# Patient Record
Sex: Male | Born: 1960
Health system: Southern US, Community
[De-identification: ages and names within clinical notes are randomized; demographics above are authoritative.]

## PROBLEM LIST (undated history)

## (undated) HISTORY — PX: REPAIR OF PERFORATED ULCER: SHX6065

---

## 2009-10-13 ENCOUNTER — Encounter: Payer: Self-pay | Admitting: Cardiology

## 2010-03-08 ENCOUNTER — Encounter: Payer: Self-pay | Admitting: Cardiology

## 2010-03-12 DIAGNOSIS — R002 Palpitations: Secondary | ICD-10-CM | POA: Insufficient documentation

## 2010-03-16 ENCOUNTER — Encounter: Payer: Self-pay | Admitting: Cardiology

## 2010-03-17 ENCOUNTER — Ambulatory Visit: Payer: Self-pay | Admitting: Cardiology

## 2010-03-17 DIAGNOSIS — K219 Gastro-esophageal reflux disease without esophagitis: Secondary | ICD-10-CM | POA: Insufficient documentation

## 2010-03-17 DIAGNOSIS — R079 Chest pain, unspecified: Secondary | ICD-10-CM | POA: Insufficient documentation

## 2010-04-08 ENCOUNTER — Telehealth (INDEPENDENT_AMBULATORY_CARE_PROVIDER_SITE_OTHER): Payer: Self-pay | Admitting: *Deleted

## 2010-04-12 ENCOUNTER — Ambulatory Visit: Payer: Self-pay

## 2010-04-12 ENCOUNTER — Ambulatory Visit: Payer: Self-pay | Admitting: Cardiology

## 2010-04-12 ENCOUNTER — Ambulatory Visit (HOSPITAL_COMMUNITY): Admission: RE | Admit: 2010-04-12 | Discharge: 2010-04-12 | Payer: Self-pay | Admitting: Cardiology

## 2010-04-16 ENCOUNTER — Telehealth: Payer: Self-pay | Admitting: Cardiology

## 2010-04-26 ENCOUNTER — Encounter: Payer: Self-pay | Admitting: Cardiology

## 2010-04-27 ENCOUNTER — Ambulatory Visit: Payer: Self-pay | Admitting: Cardiology

## 2010-08-10 ENCOUNTER — Ambulatory Visit: Admit: 2010-08-10 | Payer: Self-pay | Admitting: Cardiology

## 2010-08-10 NOTE — Miscellaneous (Signed)
  Clinical Lists Changes  Observations: Added new observation of PRIMARY MD: Merri Brunette, MD (03/16/2010 13:33) Added new observation of PAST MED HX: Insomnia Palpitations Family history.... heart disease G E R D  (03/16/2010 13:33)       Past History:  Past Medical History: Insomnia Palpitations Family history.... heart disease G E R D

## 2010-08-10 NOTE — Miscellaneous (Signed)
  Clinical Lists Changes  Observations: Added new observation of PAST MED HX: Insomnia Palpitations Family history.... heart disease G E R D Chest pain  stress echo...04/12/2010...60% EF... normal stress EF  60%...stress echo...04/2010 GI surgery for ulcer disease Lipid status (04/26/2010 12:57) Added new observation of PRIMARY MD: Merri Brunette, MD (04/26/2010 12:57)       Past History:  Past Medical History: Insomnia Palpitations Family history.... heart disease G E R D Chest pain  stress echo...04/12/2010...60% EF... normal stress EF  60%...stress echo...04/2010 GI surgery for ulcer disease Lipid status

## 2010-08-10 NOTE — Assessment & Plan Note (Signed)
Summary: per check out/sf   Visit Type:  Follow-up Primary Provider:  Merri Brunette, MD  CC:  chest pain.  History of Present Illness: Patient is seen for followup evaluation of chest pain.  I saw him in the office on March 17, 2010.  He has palpitations.  He had some chest discomfort.  He has a significant family history of coronary disease.  His LDL my history is 115.  Stress echo was done.  The patient had a good exercise level.  EKG was normal.  The stress echo images were normal.  Overall this was a normal stress echo.  Patient returns today doing well.  I have explained all the results to him and his family.  Current Medications (verified): 1)  Aspirin 81 Mg Tbec (Aspirin) .... Take One Tablet By Mouth Daily 2)  Multivitamins   Tabs (Multiple Vitamin) .... Some Days  Allergies (verified): No Known Drug Allergies  Past History:  Past Medical History: Insomnia Palpitations Family history.... heart disease G E R D Chest pain  stress echo...04/12/2010...60% EF... normal stress EF  60%...stress echo...04/2010 GI surgery for ulcer disease Lipid status  Review of Systems       Patient denies fever, chills, headache, sweats, rash, change in vision, change in hearing, chest pain, cough, nausea vomiting, urinary symptoms.  All other systems are reviewed and are negative.  Vital Signs:  Patient profile:   50 year old male Height:      70 inches Weight:      202 pounds BMI:     29.09 Pulse rate:   85 / minute BP sitting:   122 / 60  (left arm) Cuff size:   regular  Vitals Entered By: Hardin Negus, RMA (April 27, 2010 2:52 PM)  Physical Exam  General:  patient is stable. Eyes:  no xanthelasma. Neck:  no jugular venous distention. Lungs:  lungs are clear.  Respiratory effort is nonlabored. Heart:  cardiac exam reveals S1 and S2.  No clicks or significant murmurs. Abdomen:  abdomen soft Extremities:  no peripheral edema. Psych:  patient is oriented to person time  and place affect is normal.   Impression & Recommendations:  Problem # 1:  * LIPID STATUS With patient's family history I do think we should push her LDL below 100.  I talked with him about medications.  He is hesitant.  I have convinced him to start a exercise and weight loss program with followup labs.  Problem # 2:  CHEST PAIN (ICD-786.50)  His updated medication list for this problem includes:    Aspirin 81 Mg Tbec (Aspirin) .Marland Kitchen... Take one tablet by mouth daily This point there is no evidence of significant coronary disease.  He is to remain on aspirin.  No further workup at this point.  Problem # 3:  PALPITATIONS (ICD-785.1)  His updated medication list for this problem includes:    Aspirin 81 Mg Tbec (Aspirin) .Marland Kitchen... Take one tablet by mouth daily Palpitations are resolved.  No further workup.  Patient Instructions: 1)  Your physician recommends that you return for a FASTING lipid and liver profile: in 3 months before appt. with Dr Myrtis Ser (272.2) 2)  Follow up in 3 months.

## 2010-08-10 NOTE — Assessment & Plan Note (Signed)
Summary: np6/heart palp   Visit Type:  Initial Consult Primary Jaekwon Mcclune:  Merri Brunette, MD  CC:  palpitations  /  chest pain.  History of Present Illness: The patient is seen for evaluation of palpitations and chest pain.  He has a strong family history of coronary disease.  His father had coronary disease at a young age and eventually died.  The patient stopped smoking 20 years ago.  However he did dip snuff until 6 years ago.  He is active working outside.  He had some chest discomfort.  His also had some palpitations.  There's been no nausea vomiting or diaphoresis.  He has some reflux symptoms.  None of his symptoms are exertional.  Current Medications (verified): 1)  None  Allergies (verified): No Known Drug Allergies  Past History:  Past Medical History: Insomnia Palpitations Family history.... heart disease G E R D Chest pain GI surgery for ulcer disease Lipid status  Review of Systems       The patient denies fever, chills, headache, sweats, rash, change in vision, change in hearing, chest pain, cough, nausea vomiting, urinary symptoms.  All other systems are reviewed and are negative.  Vital Signs:  Patient profile:   50 year old male Height:      70 inches Weight:      195 pounds BMI:     28.08 Pulse rate:   77 / minute BP sitting:   129 / 70  (left arm) Cuff size:   regular  Vitals Entered By: Burnett Kanaris, CNA (March 17, 2010 2:56 PM)  Physical Exam  General:  The patient is quite stable. Head:  head is atraumatic. Eyes:  no xanthelasma. Neck:  no jugular distention. Chest Wall:  no chest wall tenderness. Lungs:  lungs are clear.  Respiratory effort is not labored. Heart:  cardiac exam reveals S1 and S2.  No clicks or significant murmurs. Abdomen:  abdomen is soft.  He has an old surgical scar. Msk:  no musculoskeletal deformities. Extremities:  no peripheral edema. Skin:  no skin rashes. Psych:  patient is oriented to person time and place.   Affect is normal.   Impression & Recommendations:  Problem # 1:  * LIPID STATUS The patient's total cholesterol triglycerides and HDL are good.  His LDL is 113.  We can consider whether his LDL should be pushed below 100 with his family history.  I have not recommended any medications at this time.  Problem # 2:  GERD (ICD-530.81) The patient does have GERD symptoms.  I do not think that this is angina.  Problem # 3:  PALPITATIONS (ICD-785.1)  Orders: EKG w/ Interpretation (93000) EKG is done and reviewed by me.  I did review the outside tracing.  A copy quality was difficult to assess fully.  EKG today is normal.  Problem # 4:  CHEST PAIN (ICD-786.50)  The patient has had some chest pain.  With his strong risk factors I do feel that more complete workup as needed.  We will arrange for exercise test and an echo.  I will then see him back for followup to check again on his symptoms and to review all of the data.  Orders: Nuclear Stress Test (Nuc Stress Test) Echocardiogram (Echo)  Patient Instructions: 1)  Your physician has requested that you have an echocardiogram.  Echocardiography is a painless test that uses sound waves to create images of your heart. It provides your doctor with information about the size and shape of  your heart and how well your heart's chambers and valves are working.  This procedure takes approximately one hour. There are no restrictions for this procedure. 2)  Your physician has requested that you have an exercise stress myoview.  For further information please visit [email or web address]  Please follow instruction sheet, as given. 3)  Your physician recommends that you schedule a follow-up appointment in: After echo and stress myoview is done.

## 2010-08-10 NOTE — Letter (Signed)
Summary: [facility] Physicians Office Visit Note   [facility] Physicians Office Visit Note   Imported By: Roderic Ovens 04/07/2010 12:36:01  _____________________________________________________________________  External Attachment:    Type:   Image     Comment:   External Document

## 2010-08-10 NOTE — Progress Notes (Signed)
Summary: Stress Echo Pre-Procedure  Phone Note Outgoing Call Call back at [facility] Phone [phone]   Call placed by: Antionette Char RN,  April 08, 2010 12:04 PM Call placed to: Patient Reason for Call: Confirm/change Appt Summary of Call: Left message with patient regarding instructions for stress echo. Bonnita Levan RN

## 2010-08-10 NOTE — Progress Notes (Signed)
Summary: echo results  Phone Note Call from Patient   Caller: Patient [phone] Reason for Call: Talk to Nurse, Lab or Test Results Summary of Call: pt wants echo results Initial call taken by: Glynda Jaeger,  April 16, 2010 2:49 PM  Follow-up for Phone Call        pt aware. Whitney Maeola Sarah RN  April 16, 2010 3:51 PM  Follow-up by: Whitney Maeola Sarah RN,  April 16, 2010 3:51 PM

## 2010-08-10 NOTE — Letter (Signed)
Summary: [facility]icians Office Visit Note   [facility] Physicians Office Visit Note   Imported By: Roderic Ovens 04/07/2010 12:36:30  _____________________________________________________________________  External Attachment:    Type:   Image     Comment:   External Document

## 2010-08-31 ENCOUNTER — Ambulatory Visit: Payer: Self-pay | Admitting: Cardiology

## 2010-09-23 ENCOUNTER — Other Ambulatory Visit: Payer: Self-pay

## 2010-09-23 ENCOUNTER — Ambulatory Visit: Payer: Self-pay | Admitting: Cardiology

## 2019-07-31 ENCOUNTER — Encounter (INDEPENDENT_AMBULATORY_CARE_PROVIDER_SITE_OTHER): Payer: Self-pay

## 2019-07-31 ENCOUNTER — Encounter: Payer: Self-pay | Admitting: Physical Therapy

## 2019-07-31 ENCOUNTER — Other Ambulatory Visit: Payer: Self-pay

## 2019-07-31 ENCOUNTER — Ambulatory Visit: Payer: 59 | Attending: Urology | Admitting: Physical Therapy

## 2019-07-31 DIAGNOSIS — R293 Abnormal posture: Secondary | ICD-10-CM | POA: Insufficient documentation

## 2019-07-31 DIAGNOSIS — M62838 Other muscle spasm: Secondary | ICD-10-CM | POA: Diagnosis present

## 2019-07-31 DIAGNOSIS — M25651 Stiffness of right hip, not elsewhere classified: Secondary | ICD-10-CM | POA: Insufficient documentation

## 2019-07-31 NOTE — Therapy (Addendum)
[facility] [address], [address] [place], [place], [address] Phone: [phone]   Fax:  [phone]  Physical Therapy Treatment  Patient Details  Name: Eric Goodman MRN: [id number] Date of Birth: 1961-06-26 Referring Provider (PT): Noel Christmas, MD Encounter Date: 07/31/2019  PT End of Session - 07/31/19 1715    Visit Number  1    Date for PT Re-Evaluation  10/23/19    PT Start Time  1417    PT Stop Time  1701    PT Time Calculation (min)  164 min    Activity Tolerance  Patient tolerated treatment well    Behavior During Therapy  [facility] for tasks assessed/performed       History reviewed. No pertinent past medical history.  Past Surgical History:  Procedure Laterality Date  . REPAIR OF PERFORATED ULCER     2003    There were no vitals filed for this visit.  Subjective Assessment - 07/31/19 1626    Subjective  I have had pain for a few months.  It is a lot better but still about 2-3 when it hurts.  I have had to use the bathroom a lot more frequently and had erectile dysfunction.    Pertinent History  abdominal surgery for perforated ulcer repair; was in full body cast for 6 weeks as a teenager    Patient Stated Goals  reduce pain and resolve erectile dysfunction    Currently in Pain?  Yes    Pain Score  3    when the pain is there, but doesn't have current   Pain Location  Groin    Pain Orientation  Right    Pain Descriptors / Indicators  Sharp    Pain Type  Acute pain    Pain Radiating Towards  to the rectum or prostate    Pain Onset  More than a month ago    Pain Frequency  Intermittent    Aggravating Factors   comes and goes    Pain Relieving Factors  ibuprophen 800    Effect of Pain on Daily Activities  no         [facility] PT Assessment - 08/06/19 0001      Assessment   Medical Diagnosis  Noel Christmas, MD    Referring Provider (PT)  R10.2 (ICD-10-CM) - Pelvic and perineal pain; N52.01  (ICD-10-CM) - Erectile dysfunction due to arterial insufficiency; N41.1 (ICD-10-CM) - Chronic prostatitis    Onset Date/Surgical Date  --   3 months appx   Prior Therapy  No      Precautions   Precautions  None      Restrictions   Weight Bearing Restrictions  No      Balance Screen   Has the patient fallen in the past 6 months  No      Home Environment   Living Environment  Private residence    Living Arrangements  Spouse/significant other      Prior Function   Level of Independence  Independent    Vocation  Full time employment    Vocation Requirements  [place]      Cognition   Overall Cognitive Status  Within Functional Limits for tasks assessed      Posture/Postural Control   Posture/Postural Control  Postural limitations    Postural Limitations  Increased lumbar lordosis;Increased thoracic kyphosis;Rounded Shoulders   significant at lower thoracic     PROM   Overall PROM Comments  Rt hip IR 25%; bilat hip ER 75%      Strength   Overall Strength Comments  Lt hip flexion and bilatera hip adduction 4/5      Flexibility   Soft Tissue Assessment /Muscle Length  yes    Hamstrings  hamstring 50% limited on Rt side      Palpation   Palpation comment  Rt hip elevated, lumbar paraspinals and h/s tight; diaphragm tight bilat; scar in abdomen restricted      Ambulation/Gait   Gait Pattern  Within Functional Limits                Pelvic Floor Special Questions - 07/31/19 0001    Prior Pelvic/Prostate Exam  Yes    Date of Last Pelvic/Prostate Exam  --   yearly   Marinoff Scale  --   erectile dysfunction   Urinary Leakage  Yes    Activities that cause leaking  With strong urge    Urinary urgency  Yes   burning when peeing has happened   Urinary frequency  laying at night 3-4x/night when it hurts; drinking something makes me go immediately    Fecal incontinence  No     pt identity confirmed and informed consent given to perform internal soft  tissue assessment   pt identity confirmed and informed consent given to perform internal soft tissue assessment Rectal exam 3/5 strength 2-3 sec hold  tight on Rt levator and obdurator internus          PT Education - 07/31/19 1647    Education Details  Access Code: [email or web address], urge to void and self massage techniques    Person(s) Educated  Patient    Methods  Explanation;Demonstration;Handout;Verbal cues    Comprehension  Verbalized understanding;Returned demonstration       PT Short Term Goals - 07/31/19 1720      PT SHORT TERM GOAL #1   Title  Pt will be ind with urge to void techniques    Time  4    Period  Weeks    Status  New    Target Date  08/28/19      PT SHORT TERM GOAL #2   Title  Pt will be ind with HEP    Time  4    Period  Weeks    Status  New    Target Date  10/23/19      PT SHORT TERM GOAL #3   Title  pt will understand how to engage his core correctly when lifting in order to improvecore stability and reduce risk of back pain        PT Long Term Goals - 07/31/19 1717      PT LONG TERM GOAL #1   Title  Pt will report nocturia of 1x/ night or less    Time  12    Period  Weeks    Status  New    Target Date  10/23/19      PT LONG TERM GOAL #2   Title  Pt will report no groin pain during typical day and able to perform all work related duties.    Baseline  pain up to 3/10 and does physically demanding job    Time  12    Period  Weeks    Status  New    Target Date  10/23/19      PT LONG TERM GOAL #3   Title  pt will report he is able to maintain an  erection for duration of intercourse with his spouse    Time  29    Period  Weeks    Status  New    Target Date  10/23/19      PT LONG TERM GOAL #4   Title  Pt will have 50% improvement in urinary frequency    Time  12    Period  Weeks    Status  New    Target Date  10/23/19            Plan - 08/06/19 1246    Clinical Impression Statement  Pt presents to clinic due to perineal  pain and erectile dysfunction.  Pt has chronic prostatitis.  He has not had therapy for the pelvic floor and his doctor referred him due to finding no medical issues regarding his chronic symptoms.  Upon examination, pt does have muscle tension and postural abnormalities as mentioned above. He has abdominal scar adhesions.  Pt also has decreased coordination of the palvic floor making it difficutl for him to relax the pelvic floor muscles.  Pt will benefit from skilled PT to address all impairments and return to maximum function and manage pain.,    Personal Factors and Comorbidities  Time since onset of injury/illness/exacerbation    Examination-Activity Limitations  Toileting    Stability/Clinical Decision Making  Stable/Uncomplicated    Clinical Decision Making  Low    Rehab Potential  Excellent    PT Frequency  2x / week    PT Duration  12 weeks    PT Treatment/Interventions  ADLs/Self Care Home Management;Biofeedback;Electrical Stimulation;Moist Heat;Cryotherapy;Therapeutic activities;Therapeutic exercise;Neuromuscular re-education;Manual techniques;Dry needling;Taping    PT Next Visit Plan  f/u on HEP; urge to void and toileting; biofeedback, breathe and bulge; posture    PT Home Exercise Plan  Access Code: [email or web address]    Consulted and Agree with Plan of Care  Patient       Patient will benefit from skilled therapeutic intervention in order to improve the following deficits and impairments:  Decreased coordination, Decreased strength, Impaired flexibility, Increased fascial restricitons, Increased muscle spasms, Decreased range of motion, Pain, Postural dysfunction  Visit Diagnosis: Other muscle spasm  Abnormal posture  Stiffness of right hip, not elsewhere classified     Problem List Patient Active Problem List   Diagnosis Date Noted  . GERD 03/17/2010  . CHEST PAIN 03/17/2010  . PALPITATIONS 03/12/2010    Junious Silk, PT 08/06/2019, 12:59 PM  [facility] [address] [place], [place], [address] Phone: [phone]   Fax:  [phone]  Name: Eric Goodman MRN: [id number] Date of Birth: 04/05/1961

## 2019-07-31 NOTE — Patient Instructions (Addendum)
Access Code: [email or web address]  URL: [email or web address]  Date: 07/31/2019  Prepared by: Dwana Curd   Exercises Supine Diaphragmatic Breathing with Pelvic Floor Lengthening - 10 reps - 1 sets - 3x daily - 7x weekly Supine Single Knee to Chest Stretch - 5 reps - 1 sets - 10 sec hold                            - 2x daily - 7x weekly Supine Double Knee to Chest - 10 reps - 3 sets - 1x daily - 7x weekly Supine Hip Internal and External Rotation - 10 reps - 1 sets - 5 sec hold - 1x daily - 7x weekly  Relaxation Exercises with the Urge to Void   When you experience an urge to void:  FIRST  Stop and stand very still    Sit down if you can    Don't move    You need to stay very still to maintain control  SECOND Squeeze your pelvic floor muscles 5 times, like a quick flick, to keep from leaking  THIRD Relax  Take a deep breath and then let it out  Try to make the urge go away by using relaxation and visualization techniques  FINALLY When you feel the urge go away somewhat, walk normally to the bathroom.   If the urge gets suddenly stronger on the way, you may stop again and relax to regain control.

## 2019-08-06 NOTE — Addendum Note (Signed)
Addended by: Beatris Si on: 08/06/2019 01:04 PM   Modules accepted: Orders

## 2019-08-07 ENCOUNTER — Emergency Department (HOSPITAL_COMMUNITY)
Admission: EM | Admit: 2019-08-07 | Discharge: 2019-08-07 | Disposition: A | Discharge status: Home / Self Care | Payer: 59 | Attending: Emergency Medicine | Admitting: Emergency Medicine

## 2019-08-07 ENCOUNTER — Emergency Department (HOSPITAL_COMMUNITY): Payer: 59

## 2019-08-07 ENCOUNTER — Ambulatory Visit: Payer: 59 | Admitting: Physical Therapy

## 2019-08-07 ENCOUNTER — Encounter (HOSPITAL_COMMUNITY): Payer: Self-pay

## 2019-08-07 ENCOUNTER — Other Ambulatory Visit: Payer: Self-pay

## 2019-08-07 DIAGNOSIS — I493 Ventricular premature depolarization: Secondary | ICD-10-CM | POA: Insufficient documentation

## 2019-08-07 DIAGNOSIS — F17290 Nicotine dependence, other tobacco product, uncomplicated: Secondary | ICD-10-CM | POA: Diagnosis not present

## 2019-08-07 DIAGNOSIS — R002 Palpitations: Secondary | ICD-10-CM

## 2019-08-07 DIAGNOSIS — F419 Anxiety disorder, unspecified: Secondary | ICD-10-CM | POA: Insufficient documentation

## 2019-08-07 LAB — BASIC METABOLIC PANEL
Anion gap: 11 (ref 5–15)
BUN: 25 mg/dL — ABNORMAL HIGH (ref 6–20)
CO2: 20 mmol/L — ABNORMAL LOW (ref 22–32)
Calcium: 9.2 mg/dL (ref 8.9–10.3)
Chloride: 107 mmol/L (ref 98–111)
Creatinine, Ser: 0.96 mg/dL (ref 0.61–1.24)
GFR calc Af Amer: >60 mL/min (ref >60)
GFR calc non Af Amer: >60 mL/min (ref >60)
Glucose, Bld: 93 mg/dL (ref 70–99)
Potassium: 3.9 mmol/L (ref 3.5–5.1)
Sodium: 138 mmol/L (ref 135–145)

## 2019-08-07 LAB — CBC
HCT: 45.7 % (ref 39.0–52.0)
Hemoglobin: 14.9 g/dL (ref 13.0–17.0)
MCH: 29.6 pg (ref 26.0–34.0)
MCHC: 32.6 g/dL (ref 30.0–36.0)
MCV: 90.7 fL (ref 80.0–100.0)
Platelets: 357 10*3/uL (ref 150–400)
RBC: 5.04 MIL/uL (ref 4.22–5.81)
RDW: 13.2 % (ref 11.5–15.5)
WBC: 8.4 10*3/uL (ref 4.0–10.5)
nRBC: 0 % (ref 0.0–0.2)

## 2019-08-07 LAB — TROPONIN I (HIGH SENSITIVITY): Troponin I (High Sensitivity): <2 ng/L (ref <18)

## 2019-08-07 NOTE — ED Triage Notes (Signed)
Pt reports heart palpitations for the last 2 weeks. Pt saw his PCP this week and EMS came out to his home yesterday for the same but pt did not go to the hospital. Pt reports 2 panic attacks that has increased his palpitations, pt anxious in triage. Pt a.o.

## 2019-08-07 NOTE — ED Provider Notes (Signed)
[facility] EMERGENCY DEPARTMENT Provider Note   CSN: [id number] Arrival date & time: 08/07/19  1606     History Chief Complaint  Patient presents with  . Palpitations    Eric Goodman is a 59 y.o. male with a past medical history of GERD, palpitations, anxiety presenting to the ED with a chief complaint of palpitations.  For the past week reports intermittent palpitations that are been worsened when he has a panic attack.  States that his symptoms got worse today when he had a panic attack at home.  He was evaluated by EMS yesterday for similar symptoms but did not want to be evaluated in the ED.  He had improvement with his palpitations and anxiety with his home Ativan.  He last saw a cardiologist about 10 years ago with negative stress test.  States that his palpitations have improved since arrival in the ED.  Denies any chest pain, shortness of breath, cough, abdominal pain, vomiting, diarrhea, fever, history of DVT or PE, leg swelling or recent immobilization.  HPI     History reviewed. No pertinent past medical history.  Patient Active Problem List   Diagnosis Date Noted  . GERD 03/17/2010  . CHEST PAIN 03/17/2010  . PALPITATIONS 03/12/2010    Past Surgical History:  Procedure Laterality Date  . REPAIR OF PERFORATED ULCER     2003       No family history on file.  Social History   Tobacco Use  . Smoking status: Never Smoker  . Smokeless tobacco: Current User  Substance Use Topics  . Alcohol use: Not on file  . Drug use: Not on file    Home Medications Prior to Admission medications   Not on File    Allergies    Patient has no allergy information on record.  Review of Systems   Review of Systems  Constitutional: Negative for appetite change, chills and fever.  HENT: Negative for ear pain, rhinorrhea, sneezing and sore throat.   Eyes: Negative for photophobia and visual disturbance.  Respiratory: Negative for cough, chest  tightness, shortness of breath and wheezing.   Cardiovascular: Positive for palpitations. Negative for chest pain.  Gastrointestinal: Negative for abdominal pain, blood in stool, constipation, diarrhea, nausea and vomiting.  Genitourinary: Negative for dysuria, hematuria and urgency.  Musculoskeletal: Negative for myalgias.  Skin: Negative for rash.  Neurological: Negative for dizziness, weakness and light-headedness.    Physical Exam Updated Vital Signs BP 133/89 (BP Location: Left Arm)   Pulse 80   Temp 98 F (36.7 C) (Oral)   Resp 18   SpO2 99%   Physical Exam Vitals and nursing note reviewed.  Constitutional:      General: He is not in acute distress.    Appearance: He is well-developed.     Comments: Speaking in complete sentences without difficulty.  HENT:     Head: Normocephalic and atraumatic.     Nose: Nose normal.  Eyes:     General: No scleral icterus.       Left eye: No discharge.     Conjunctiva/sclera: Conjunctivae normal.  Cardiovascular:     Rate and Rhythm: Normal rate and regular rhythm.     Heart sounds: Normal heart sounds. No murmur. No friction rub. No gallop.   Pulmonary:     Effort: Pulmonary effort is normal. No respiratory distress.     Breath sounds: Normal breath sounds.  Abdominal:     General: Bowel sounds are normal. There is  no distension.     Palpations: Abdomen is soft.     Tenderness: There is no abdominal tenderness. There is no guarding.  Musculoskeletal:        General: Normal range of motion.     Cervical back: Normal range of motion and neck supple.     Right lower leg: No edema.     Left lower leg: No edema.     Comments: No lower extremity edema, erythema or calf tenderness bilaterally.  Skin:    General: Skin is warm and dry.     Findings: No rash.  Neurological:     Mental Status: He is alert.     Motor: No abnormal muscle tone.     Coordination: Coordination normal.     ED Results / Procedures / Treatments    Labs (all labs ordered are listed, but only abnormal results are displayed) Labs Reviewed  BASIC METABOLIC PANEL - Abnormal; Notable for the following components:      Result Value   CO2 20 (*)    BUN 25 (*)    All other components within normal limits  CBC  TROPONIN I (HIGH SENSITIVITY)    EKG EKG Interpretation  Date/Time:  Wednesday August 07 2019 16:24:52 EST Ventricular Rate:  94 PR Interval:  132 QRS Duration: 78 QT Interval:  352 QTC Calculation: 440 R Axis:   53 Text Interpretation: Sinus rhythm with occasional Premature ventricular complexes Otherwise normal ECG Confirmed by Kennis Carina [id number]) on 08/07/2019 9:15:26 PM   Radiology DG Chest 2 View  Result Date: 08/07/2019 CLINICAL DATA:  Chest pain palpitation EXAM: CHEST - 2 VIEW COMPARISON:  08/13/2018 FINDINGS: The heart size and mediastinal contours are within normal limits. Both lungs are clear. Mild chronic wedging at the thoracolumbar junction. IMPRESSION: No active cardiopulmonary disease. Electronically Signed   By: Jasmine Pang M.D.   On: 08/07/2019 16:49    Procedures Procedures (including critical care time)  Medications Ordered in ED Medications - No data to display  ED Course  I have reviewed the triage vital signs and the nursing notes.  Pertinent labs & imaging results that were available during my care of the patient were reviewed by me and considered in my medical decision making (see chart for details).    MDM Rules/Calculators/A&P                      59yo M with a past medical history of anxiety presenting to the ED for palpitations that have been intermittent for the past week but worsened today.  He took a dose of his home Ativan with improvement in his palpitations and anxiety. He denies any prior history of DVT, PE, leg swelling or recent immobilization.  He was seen and evaluated by cardiology about 10 years ago with negative stress test.  He denies any vomiting, diarrhea or other  GI symptoms that would predispose him to electrolyte abnormalities.  He is overall well-appearing on my exam.  No lower extremity edema, erythema or calf tenderness that would concern me for DVT.  Is speaking complete sentences without difficulty, no tremors noted.  Denies any chest pain.  Work-up here including troponin, CBC, BMP unremarkable.  EKG shows sinus rhythm with occasional PVCs. Patient without any recurrence of symptoms here in the ED. Suspect that his symptoms are caused by these occasional PVCs and his anxiety.  I doubt ACS, PE or other emergent cause of his symptoms as he is low risk by  Well's criteria and HEART score. Feel that he will benefit from follow-up with cardiology for possible monitoring.  He is agreeable to discharge home.  Return precautions given.   Patient is hemodynamically stable, in NAD, and able to ambulate in the ED. Evaluation does not show pathology that would require ongoing emergent intervention or inpatient treatment. I explained the diagnosis to the patient. Pain has been managed and has no complaints prior to discharge. Patient is comfortable with above plan and is stable for discharge at this time. All questions were answered prior to disposition. Strict return precautions for returning to the ED were discussed. Encouraged follow up with PCP.   An After Visit Summary was printed and given to the patient.   Portions of this note were generated with [employer]obby[employer]. Dictation errors may occur despite best attempts at proofreading.  Final Clinical Impression(s) / ED Diagnoses Final diagnoses:  Palpitations  PVC (premature ventricular contraction)    Rx / DC Orders ED Discharge Orders    None       Delia Heady, PA-C 08/07/19 2137    Maudie Flakes, MD 08/07/19 2306

## 2019-08-07 NOTE — Discharge Instructions (Addendum)
Continue your home medications as they were previously prescribed. Follow-up with your cardiologist for further evaluation. Return to the ED if you start to experience chest pain, shortness of breath, leg swelling, numbness in arms or legs or blurry vision.

## 2019-08-13 ENCOUNTER — Telehealth: Payer: Self-pay | Admitting: Radiology

## 2019-08-13 ENCOUNTER — Other Ambulatory Visit: Payer: Self-pay

## 2019-08-13 ENCOUNTER — Ambulatory Visit (INDEPENDENT_AMBULATORY_CARE_PROVIDER_SITE_OTHER): Payer: 59 | Admitting: Cardiovascular Disease

## 2019-08-13 ENCOUNTER — Encounter: Payer: Self-pay | Admitting: Cardiovascular Disease

## 2019-08-13 VITALS — BP 133/84 | HR 72 | Ht 70.5 in | Wt 210.0 lb

## 2019-08-13 DIAGNOSIS — E782 Mixed hyperlipidemia: Secondary | ICD-10-CM | POA: Diagnosis not present

## 2019-08-13 DIAGNOSIS — R002 Palpitations: Secondary | ICD-10-CM | POA: Diagnosis not present

## 2019-08-13 DIAGNOSIS — E785 Hyperlipidemia, unspecified: Secondary | ICD-10-CM | POA: Insufficient documentation

## 2019-08-13 DIAGNOSIS — R0789 Other chest pain: Secondary | ICD-10-CM

## 2019-08-13 NOTE — Assessment & Plan Note (Signed)
History of hyperlipidemia not on statin therapy with lipid profile performed 10/25/2016 revealing total cholesterol 188, LDL 123 and HDL 51.  I am going to get a fasting lipid liver profile to further evaluate

## 2019-08-13 NOTE — Progress Notes (Signed)
08/13/2019 SAMBA CUMBA   03/03/1961  [id number]  Primary Physician System, Pcp Not In Primary Cardiologist: Lorretta Harp MD Garret Reddish, [place], [place]  HPI:  Eric Goodman is a 59 y.o. mildly overweight married Caucasian male with no children referred by Dr. Nancy Fetter for cardiovascular valuation because of palpitation and atypical chest pain.  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  He has seen Dr. Dola Argyle, cardiologist, 10 years ago for evaluation of palpitations, atypical chest pain and hyperlipidemia.  He did have a negative stress echo at that time.  He has no other cardiac risk factors other than mild untreated hyperlipidemia.  His father apparently had an ICD was taken care of by Dr. Rollene Fare as mother may have had stents as well.  He has been fairly asymptomatic until several weeks ago when he had recurrence of his palpitations.  He was recently seen in the emergency room 08/07/2019 where he was in sinus rhythm his work-up was unremarkable.   Current Meds  Medication Sig  . fluticasone (FLONASE) 50 MCG/ACT nasal spray Place 2 sprays into both nostrils daily.  Marland Kitchen LORazepam (ATIVAN) 0.5 MG tablet Take 0.5 mg by mouth every 8 (eight) hours.  Marland Kitchen omeprazole (PRILOSEC) 20 MG capsule Take 20 mg by mouth daily.  . sertraline (ZOLOFT) 50 MG tablet Take 50 mg by mouth daily.  . tamsulosin (FLOMAX) 0.4 MG CAPS capsule Take 0.4 mg by mouth at bedtime.     Allergies  Allergen Reactions  . Aspirin     Social History   Socioeconomic History  . Marital status: Married    Spouse name: Not on file  . Number of children: Not on file  . Years of education: Not on file  . Highest education level: Not on file  Occupational History  . Not on file  Tobacco Use  . Smoking status: Never Smoker  . Smokeless tobacco: Current User  Substance and Sexual Activity  . Alcohol use: Not on file  . Drug use: Not on file  . Sexual activity: Not on file  Other Topics Concern  . Not on file    Social History Narrative  . Not on file   Social Determinants of Health   Financial Resource Strain:   . Difficulty of Paying Living Expenses: Not on file  Food Insecurity:   . Worried About Charity fundraiser in the Last Year: Not on file  . Ran Out of Food in the Last Year: Not on file  Transportation Needs:   . Lack of Transportation (Medical): Not on file  . Lack of Transportation (Non-Medical): Not on file  Physical Activity:   . Days of Exercise per Week: Not on file  . Minutes of Exercise per Session: Not on file  Stress:   . Feeling of Stress : Not on file  Social Connections:   . Frequency of Communication with Friends and Family: Not on file  . Frequency of Social Gatherings with Friends and Family: Not on file  . Attends Religious Services: Not on file  . Active Member of Clubs or Organizations: Not on file  . Attends Archivist Meetings: Not on file  . Marital Status: Not on file  Intimate Partner Violence:   . Fear of Current or Ex-Partner: Not on file  . Emotionally Abused: Not on file  . Physically Abused: Not on file  . Sexually Abused: Not on file     Review of Systems: General: negative  for chills, fever, night sweats or weight changes.  Cardiovascular: negative for chest pain, dyspnea on exertion, edema, orthopnea, palpitations, paroxysmal nocturnal dyspnea or shortness of breath Dermatological: negative for rash Respiratory: negative for cough or wheezing Urologic: negative for hematuria Abdominal: negative for nausea, vomiting, diarrhea, bright red blood per rectum, melena, or hematemesis Neurologic: negative for visual changes, syncope, or dizziness All other systems reviewed and are otherwise negative except as noted above.    Blood pressure 133/84, pulse 72, height 5' 10.5" (1.791 m), weight 210 lb (95.3 kg), SpO2 97 %.  General appearance: alert and no distress Neck: no adenopathy, no carotid bruit, no JVD, supple, symmetrical,  trachea midline and thyroid not enlarged, symmetric, no tenderness/mass/nodules Lungs: clear to auscultation bilaterally Heart: regular rate and rhythm, S1, S2 normal, no murmur, click, rub or gallop Extremities: extremities normal, atraumatic, no cyanosis or edema Pulses: 2+ and symmetric Skin: Skin color, texture, turgor normal. No rashes or lesions Neurologic: Alert and oriented X 3, normal strength and tone. Normal symmetric reflexes. Normal coordination and gait  EKG not performed today  ASSESSMENT AND PLAN:   Hyperlipidemia History of hyperlipidemia not on statin therapy with lipid profile performed 10/25/2016 revealing total cholesterol 188, LDL 123 and HDL 51.  I am going to get a fasting lipid liver profile to further evaluate  PALPITATIONS Long history of palpitations thought to be PVCs related to anxiety in the past improved with Ativan.  And can get a 2-week Zio patch to further evaluate  CHEST PAIN History of atypical chest pain with minimal cardiac risk factors.  He has had a normal stress echo by Dr. Myrtis Ser 10 years ago.  I am going to get a coronary calcium score to further evaluate      Runell Gess MD [facility], [facility] 08/13/2019 5:04 PM

## 2019-08-13 NOTE — Patient Instructions (Addendum)
Medication Instructions:  Your physician recommends that you continue on your current medications as directed. Please refer to the Current Medication list given to you today.  If you need a refill on your cardiac medications before your next appointment, please call your pharmacy.   Lab work: Fasting lipids and hepatic function  If you have labs (blood work) drawn today and your tests are completely normal, you will receive your results only by: [place] (if you have MyChart) OR A paper copy in the mail If you have any lab test that is abnormal or we need to change your treatment, we will call you to review the results.  Testing/Procedures: Coronary Calcium Score  AND  2 Week Zio Patch  Follow-Up: At [facility], you and your health needs are our priority.  As part of our continuing mission to provide you with exceptional heart care, we have created designated Provider Care Teams.  These Care Teams include your primary Cardiologist (physician) and Advanced Practice Providers (APPs -  Physician Assistants and Nurse Practitioners) who all work together to provide you with the care you need, when you need it. You may see Dr. Gwenlyn Found or one of the following Advanced Practice Providers on your designated Care Team:    Kerin Ransom, PA-C  [place], [place]  Coletta Memos, [place]  Your physician wants you to follow-up in: 4-6 weeks with Dr. Gwenlyn Found  Any Other Special Instructions Will Be Listed Below (If Applicable).   Coronary Calcium Scan A coronary calcium scan is an imaging test used to look for deposits of plaque in the inner lining of the blood vessels of the heart (coronary arteries). Plaque is made up of calcium, protein, and fatty substances. These deposits of plaque can partly clog and narrow the coronary arteries without producing any symptoms or warning signs. This puts a person at risk for a heart attack. This test is recommended for people who are at moderate risk for  heart disease. The test can find plaque deposits before symptoms develop. Tell a health care provider about:  Any allergies you have.  All medicines you are taking, including vitamins, herbs, eye drops, creams, and over-the-counter medicines.  Any problems you or family members have had with anesthetic medicines.  Any blood disorders you have.  Any surgeries you have had.  Any medical conditions you have.  Whether you are pregnant or may be pregnant. What are the risks? Generally, this is a safe procedure. However, problems may occur, including:  Harm to a pregnant woman and her unborn baby. This test involves the use of radiation. Radiation exposure can be dangerous to a pregnant woman and her unborn baby. If you are pregnant or think you may be pregnant, you should not have this procedure done.  Slight increase in the risk of cancer. This is because of the radiation involved in the test. What happens before the procedure? Ask your health care provider for any specific instructions on how to prepare for this procedure. You may be asked to avoid products that contain caffeine, tobacco, or nicotine for 4 hours before the procedure. What happens during the procedure?   You will undress and remove any jewelry from your neck or chest.  You will put on a hospital gown.  Sticky electrodes will be placed on your chest. The electrodes will be connected to an electrocardiogram (ECG) machine to record a tracing of the electrical activity of your heart.  You will lie down on a curved bed that is  attached to the CT scanner.  You may be given medicine to slow down your heart rate so that clear pictures can be created.  You will be moved into the CT scanner, and the CT scanner will take pictures of your heart. During this time, you will be asked to lie still and hold your breath for 2-3 seconds at a time while each picture of your heart is being taken. The procedure may vary among health care  providers and hospitals. What happens after the procedure?  You can get dressed.  You can return to your normal activities.  It is up to you to get the results of your procedure. Ask your health care provider, or the department that is doing the procedure, when your results will be ready. Summary  A coronary calcium scan is an imaging test used to look for deposits of plaque in the inner lining of the blood vessels of the heart (coronary arteries). Plaque is made up of calcium, protein, and fatty substances.  Generally, this is a safe procedure. Tell your health care provider if you are pregnant or may be pregnant.  Ask your health care provider for any specific instructions on how to prepare for this procedure.  A CT scanner will take pictures of your heart.  You can return to your normal activities after the scan is done. This information is not intended to replace advice given to you by your health care provider. Make sure you discuss any questions you have with your health care provider. Document Revised: 01/15/2019 Document Reviewed: 01/15/2019 Elsevier Patient Education  2020 [employer].  Your physician has recommended that you wear a 14 DAY ZIO-PATCH monitor. The Zio patch cardiac monitor continuously records heart rhythm data for up to 14 days, this is for patients being evaluated for multiple types heart rhythms. For the first 24 hours post application, please avoid getting the Zio monitor wet in the shower or by excessive sweating during exercise. After that, feel free to carry on with regular activities. Keep soaps and lotions away from the ZIO XT Patch.  This will be mailed to you, please expect 7-10 days to receive.

## 2019-08-13 NOTE — Telephone Encounter (Signed)
Enrolled patient for a 14 day Zio monitor to be mailed to patients home.  

## 2019-08-13 NOTE — Assessment & Plan Note (Signed)
Long history of palpitations thought to be PVCs related to anxiety in the past improved with Ativan.  And can get a 2-week Zio patch to further evaluate

## 2019-08-13 NOTE — Assessment & Plan Note (Signed)
History of atypical chest pain with minimal cardiac risk factors.  He has had a normal stress echo by Dr. Myrtis Ser 10 years ago.  I am going to get a coronary calcium score to further evaluate

## 2019-08-14 ENCOUNTER — Telehealth: Payer: Self-pay | Admitting: Cardiovascular Disease

## 2019-08-14 ENCOUNTER — Encounter: Payer: Self-pay | Admitting: Physical Therapy

## 2019-08-14 LAB — HEPATIC FUNCTION PANEL
ALT: 26 IU/L (ref 0–44)
AST: 14 IU/L (ref 0–40)
Albumin: 4.7 g/dL (ref 3.8–4.9)
Alkaline Phosphatase: 53 IU/L (ref 39–117)
Bilirubin Total: 0.4 mg/dL (ref 0.0–1.2)
Bilirubin, Direct: 0.13 mg/dL (ref 0.00–0.40)
Total Protein: 7.4 g/dL (ref 6.0–8.5)

## 2019-08-14 LAB — LIPID PANEL
Chol/HDL Ratio: 3.2 ratio (ref 0.0–5.0)
Cholesterol, Total: 181 mg/dL (ref 100–199)
HDL: 56 mg/dL (ref >39)
LDL Chol Calc (NIH): 112 mg/dL — ABNORMAL HIGH (ref 0–99)
Triglycerides: 70 mg/dL (ref 0–149)
VLDL Cholesterol Cal: 13 mg/dL (ref 5–40)

## 2019-08-14 NOTE — Telephone Encounter (Signed)
Left a message for the patient to call back.  

## 2019-08-14 NOTE — Telephone Encounter (Signed)
Patient calling requesting a note to go back to work on Monday 2/8.

## 2019-08-14 NOTE — Telephone Encounter (Signed)
Called pt and explained that Dr. Allyson Sabal typically does not write out of work letters for palpitations. Pt stated he just was not feeling well. Asked pt what symptoms he was having, he stated that it was just palpitations. Pt verbalized understanding about Dr. Allyson Sabal not writing a note.

## 2019-08-14 NOTE — Telephone Encounter (Signed)
Patient returning call.

## 2019-08-14 NOTE — Telephone Encounter (Signed)
The patient stated that he felt like it was better for him to be out of work until 2/8 due to his palpitations. He would like to know if Dr. Allyson Sabal would write him a note excusing him from work until 08/19/19.

## 2019-08-15 ENCOUNTER — Encounter: Payer: Self-pay | Admitting: *Deleted

## 2019-08-15 ENCOUNTER — Other Ambulatory Visit: Payer: Self-pay | Admitting: *Deleted

## 2019-08-15 DIAGNOSIS — E782 Mixed hyperlipidemia: Secondary | ICD-10-CM

## 2019-08-16 ENCOUNTER — Other Ambulatory Visit (INDEPENDENT_AMBULATORY_CARE_PROVIDER_SITE_OTHER): Payer: 59

## 2019-08-16 DIAGNOSIS — R002 Palpitations: Secondary | ICD-10-CM

## 2019-08-19 ENCOUNTER — Telehealth: Payer: Self-pay | Admitting: Cardiovascular Disease

## 2019-08-19 MED ORDER — METOPROLOL SUCCINATE ER 25 MG PO TB24: 25.0000 mg | ORAL_TABLET | Freq: Every day | ORAL | 3 refills | Status: DC

## 2019-08-19 NOTE — Telephone Encounter (Signed)
Pt called to report that his palpitations are worsening and he is having increased anxiety with them. He denies dizziness, SOB, light-headedness but because if the palpations mixed with anxiety since they are making him worried about his health he does not "feel well"... he went to a walk in clinic this past Saturday and they gave gim Lorazepam but he still thinks he needs something to control the palpations. He has only been wearing the Zio patch for only a few days but does not feel he can wait until the monitor is finished to be treated.   Will forward to Dr. Allyson Sabal for review and recommendations.

## 2019-08-19 NOTE — Telephone Encounter (Signed)
Start on Toprol-XL 25 mg a day

## 2019-08-19 NOTE — Telephone Encounter (Signed)
Patient c/o Palpitations:  High priority if patient c/o lightheadedness, shortness of breath, or chest pain  1) How long have you had palpitations/irregular HR/ Afib? Are you having the symptoms now? Off and on and yes having them now/.  2) Are you currently experiencing lightheadedness, SOB or CP? No but his back hurts SOB alittle  3) Do you have a history of afib (atrial fibrillation) or irregular heart rhythm? no  4) Have you checked your BP or HR? (document readings if available) NO  5) Are you experiencing any other symptoms? Patient is weak.

## 2019-08-19 NOTE — Telephone Encounter (Signed)
Called pt and put in new order for medication. Pt verbalized understanding.

## 2019-08-21 ENCOUNTER — Encounter: Payer: Self-pay | Admitting: Physical Therapy

## 2019-08-22 ENCOUNTER — Telehealth: Payer: Self-pay | Admitting: Cardiovascular Disease

## 2019-08-22 NOTE — Telephone Encounter (Signed)
Pt c/o medication issue:  1. Name of Medication: metoprolol succinate (TOPROL XL) 25 MG 24 hr tablet  2. How are you currently taking this medication (dosage and times per day)? Every morning  3. Are you having a reaction (difficulty breathing--STAT)? no  4. What is your medication issue? Patient states he felt fine yesterday, but this morning he started feeling dizzy and has been constantly using the bathroom. He also says he has been having a sharp pain in his shoulder. Please advise.

## 2019-08-22 NOTE — Telephone Encounter (Signed)
LMTCB

## 2019-08-26 ENCOUNTER — Telehealth: Payer: Self-pay | Admitting: Cardiovascular Disease

## 2019-08-26 NOTE — Telephone Encounter (Signed)
Spoke with patient. Patient reports he is not having any symptoms associated with metoprolol. Patient had question about heart monitor. Patient educated to mail heart monitor back to the address provided by the company. Patient verbalized understanding.

## 2019-08-26 NOTE — Telephone Encounter (Signed)
Left message for pt to call.

## 2019-08-26 NOTE — Telephone Encounter (Signed)
New message  Patient c/o Palpitations:  High priority if patient c/o lightheadedness, shortness of breath, or chest pain  1) How long have you had palpitations/irregular HR/ Afib? Are you having the symptoms now? Per patient has had palpitations for a couple of weeks, yes   2) Are you currently experiencing lightheadedness, SOB or CP?no   3) Do you have a history of afib (atrial fibrillation) or irregular heart rhythm? No   4) Have you checked your BP or HR? (document readings if available): no   5) Are you experiencing any other symptoms? No

## 2019-08-26 NOTE — Telephone Encounter (Signed)
Spoke with patient. Patient calling in due to "jerking at time of sleep." Patient reports his body jerks all over when he tries to go to bed. Advised patient to call PCP and follow up with cardiology if recommended by PCP after evaluation. Patient verbalized understanding.

## 2019-08-29 ENCOUNTER — Encounter: Payer: Self-pay | Admitting: Physical Therapy

## 2019-09-03 ENCOUNTER — Ambulatory Visit (INDEPENDENT_AMBULATORY_CARE_PROVIDER_SITE_OTHER)
Admission: RE | Admit: 2019-09-03 | Discharge: 2019-09-03 | Disposition: A | Discharge status: Home / Self Care | Payer: Self-pay | Source: Ambulatory Visit | Attending: Cardiovascular Disease | Admitting: Cardiovascular Disease

## 2019-09-03 ENCOUNTER — Other Ambulatory Visit: Payer: Self-pay

## 2019-09-03 DIAGNOSIS — R002 Palpitations: Secondary | ICD-10-CM

## 2019-09-10 ENCOUNTER — Encounter: Payer: Self-pay | Admitting: Cardiovascular Disease

## 2019-09-10 ENCOUNTER — Other Ambulatory Visit: Payer: Self-pay

## 2019-09-10 ENCOUNTER — Ambulatory Visit (INDEPENDENT_AMBULATORY_CARE_PROVIDER_SITE_OTHER): Payer: 59 | Admitting: Cardiovascular Disease

## 2019-09-10 VITALS — BP 124/74 | HR 68 | Temp 98.2°F | Ht 70.5 in | Wt 208.0 lb

## 2019-09-10 DIAGNOSIS — E782 Mixed hyperlipidemia: Secondary | ICD-10-CM

## 2019-09-10 DIAGNOSIS — R002 Palpitations: Secondary | ICD-10-CM

## 2019-09-10 DIAGNOSIS — R0789 Other chest pain: Secondary | ICD-10-CM

## 2019-09-10 NOTE — Patient Instructions (Signed)
Medication Instructions:  Your physician recommends that you continue on your current medications as directed. Please refer to the Current Medication list given to you today.  If you need a refill on your cardiac medications before your next appointment, please call your pharmacy.   Lab work: Fasting Lipids and Hepatic Function in 3 months If you have labs (blood work) drawn today and your tests are completely normal, you will receive your results only by: [place] (if you have MyChart) OR A paper copy in the mail If you have any lab test that is abnormal or we need to change your treatment, we will call you to review the results.  Testing/Procedures: NONE  Follow-Up: At [facility], you and your health needs are our priority.  As part of our continuing mission to provide you with exceptional heart care, we have created designated Provider Care Teams.  These Care Teams include your primary Cardiologist (physician) and Advanced Practice Providers (APPs -  Physician Assistants and Nurse Practitioners) who all work together to provide you with the care you need, when you need it. You may see Dr. Gwenlyn Found or one of the following Advanced Practice Providers on your designated Care Team:    Kerin Ransom, PA-C  [place], [place]  Coletta Memos, [place]  Your physician wants you to follow-up in: 6 months with Dr. Gwenlyn Found. You will receive a reminder letter in the mail two months in advance. If you don't receive a letter, please call our office to schedule the follow-up appointment.   Any Other Special Instructions Will Be Listed Below (If Applicable).   Cooking With Less Salt Cooking with less salt is one way to reduce the amount of sodium you get from food. Depending on your condition and overall health, your health care provider or diet and nutrition specialist (dietitian) may recommend that you reduce your sodium intake. Most people should have less than 2,300 milligrams (mg) of sodium  each day. If you have high blood pressure (hypertension), you may need to limit your sodium to 1,500 mg each day. Follow the tips below to help reduce your sodium intake. What do I need to know about cooking with less salt? Shopping  Buy sodium-free or low-sodium products. Look for the following words on food labels: ? Low-sodium. ? Sodium-free. ? Reduced-sodium. ? No salt added. ? Unsalted.  Buy fresh or frozen vegetables. Avoid canned vegetables.  Avoid buying meats or protein foods that have been injected with broth or saline solution.  Avoid cured or smoked meats, such as hot dogs, bacon, salami, ham, and bologna. Reading food labels   Check the food label before buying or using packaged ingredients.  Look for products with no more than 140 mg of sodium in one serving.  Do not choose foods with salt as one of the first three ingredients on the ingredients list. If salt is one of the first three ingredients, it usually means the item is high in sodium, because ingredients are listed in order of amount in the food item. Cooking  Use herbs, seasonings without salt, and spices as substitutes for salt in foods.  Use sodium-free baking soda when baking.  Grill, braise, or roast foods to add flavor with less salt.  Avoid adding salt to pasta, rice, or hot cereals while cooking.  Drain and rinse canned vegetables before use.  Avoid adding salt when cooking sweets and desserts.  Cook with low-sodium ingredients. What are some salt alternatives? The following are herbs, seasonings, and spices  that can be used instead of salt to give taste to your food. Herbs should be fresh or dried. Do not choose packaged mixes. Next to the name of the herb, spice, or seasoning are some examples of foods you can pair it with. Herbs  Bay leaves - Soups, meat and vegetable dishes, and spaghetti sauce.  Basil - [employer], soups, pasta, and fish dishes.  Cilantro - Meat, poultry, and  vegetable dishes.  Chili powder - Marinades and Mexican dishes.  Chives - Salad dressings and potato dishes.  Cumin - Mexican dishes, couscous, and meat dishes.  Dill - Fish dishes, sauces, and salads.  Fennel - Meat and vegetable dishes, breads, and cookies.  Garlic (do not use garlic salt) - [place] dishes, meat dishes, salad dressings, and sauces.  Marjoram - Soups, potato dishes, and meat dishes.  Oregano - Pizza and spaghetti sauce.  Parsley - Salads, soups, pasta, and meat dishes.  Rosemary - [place] dishes, salad dressings, soups, and red meats.  Saffron - Fish dishes, pasta, and some poultry dishes.  Sage - Stuffings and sauces.  Tarragon - Fish and Whole Foods.  Thyme - Stuffing, meat, and fish dishes. Seasonings  Lemon juice - Fish dishes, poultry dishes, vegetables, and salads.  Vinegar - Salad dressings, vegetables, and fish dishes. Spices  Cinnamon - Sweet dishes, such as cakes, cookies, and puddings.  Cloves - Gingerbread, puddings, and marinades for meats.  Curry - Vegetable dishes, fish and poultry dishes, and stir-fry dishes.  Ginger - Vegetables dishes, fish dishes, and stir-fry dishes.  Nutmeg - Pasta, vegetables, poultry, fish dishes, and custard. What are some low-sodium ingredients and foods?  Fresh or frozen fruits and vegetables with no sauce added.  Fresh or frozen whole meats, poultry, and fish with no sauce added.  Eggs.  Noodles, pasta, quinoa, rice.  Shredded or puffed wheat or puffed rice.  Regular or quick oats.  Milk, yogurt, hard cheeses, and low-sodium cheeses. Good cheese choices include Swiss, [employer], and [address]. Always check the label for the serving size and sodium content.  Unsalted butter or margarine.  Unsalted nuts.  Sherbet or ice cream (keep to  cup per serving).  Homemade pudding.  Sodium-free baking soda and baking powder. This is not a complete list of low-sodium ingredients and foods.  Contact your dietitian for more options. Summary  Cooking with less salt is one way to reduce the amount of sodium that you get from food.  Buy sodium-free or low-sodium products.  Check the food label before using or buying packaged ingredients.  Use herbs, seasonings without salt, and spices as substitutes for salt in foods. This information is not intended to replace advice given to you by your health care provider. Make sure you discuss any questions you have with your health care provider. Document Revised: 06/09/2017 Document Reviewed: 07/05/2016 Elsevier Patient Education  2020 [employer].

## 2019-09-10 NOTE — Progress Notes (Signed)
09/10/2019 Florinda Marker   [birth year]  [id number]  Primary Physician System, Pcp Not In Primary Cardiologist: Runell Gess MD Milagros Loll, [place], [place]  HPI:  Eric Goodman is a 59 y.o.  mildly overweight married Caucasian male with no children referred by Dr. Wynelle Link for cardiovascular valuation because of palpitation and atypical chest pain.  I last saw him in the office 08/13/2019. 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  He has seen Dr. Willa Rough, cardiologist, 10 years ago for evaluation of palpitations, atypical chest pain and hyperlipidemia.  He did have a negative stress echo at that time.  He has no other cardiac risk factors other than mild untreated hyperlipidemia.  His father apparently had an ICD was taken care of by Dr. Alanda Amass as mother may have had stents as well.  He has been fairly asymptomatic until several weeks ago when he had recurrence of his palpitations.  He was recently seen in the emergency room 08/07/2019 where he was in sinus rhythm his work-up was unremarkable.  I got an event monitor which showed PVCs with short runs of SVT and a coronary calcium score performed 09/03/2019 which was 0.  I did start him on low-dose beta-blockade which was resulted in marked improvement in his palpitations.  He is also seeing a psychologist to manage stress which may also have an impact on his palpitations.  He has atypical chest pain which I have reassured him is noncardiac.    Current Meds  Medication Sig  . fluticasone (FLONASE) 50 MCG/ACT nasal spray Place 2 sprays into both nostrils daily.  Marland Kitchen LORazepam (ATIVAN) 0.5 MG tablet Take 0.5 mg by mouth every 8 (eight) hours.  . metoprolol succinate (TOPROL XL) 25 MG 24 hr tablet Take 1 tablet (25 mg total) by mouth daily.  Marland Kitchen omeprazole (PRILOSEC) 20 MG capsule Take 20 mg by mouth daily.  . sertraline (ZOLOFT) 50 MG tablet Take 100 mg by mouth daily.   . tamsulosin (FLOMAX) 0.4 MG CAPS capsule Take 0.4 mg by mouth at bedtime.      Allergies  Allergen Reactions  . Aspirin     Social History   Socioeconomic History  . Marital status: Married    Spouse name: Not on file  . Number of children: Not on file  . Years of education: Not on file  . Highest education level: Not on file  Occupational History  . Not on file  Tobacco Use  . Smoking status: Never Smoker  . Smokeless tobacco: Current User  Substance and Sexual Activity  . Alcohol use: Not on file  . Drug use: Not on file  . Sexual activity: Not on file  Other Topics Concern  . Not on file  Social History Narrative  . Not on file   Social Determinants of Health   Financial Resource Strain:   . Difficulty of Paying Living Expenses: Not on file  Food Insecurity:   . Worried About Programme researcher, broadcasting/film/video in the Last Year: Not on file  . Ran Out of Food in the Last Year: Not on file  Transportation Needs:   . Lack of Transportation (Medical): Not on file  . Lack of Transportation (Non-Medical): Not on file  Physical Activity:   . Days of Exercise per Week: Not on file  . Minutes of Exercise per Session: Not on file  Stress:   . Feeling of Stress : Not on file  Social Connections:   . Frequency  of Communication with Friends and Family: Not on file  . Frequency of Social Gatherings with Friends and Family: Not on file  . Attends Religious Services: Not on file  . Active Member of Clubs or Organizations: Not on file  . Attends Archivist Meetings: Not on file  . Marital Status: Not on file  Intimate Partner Violence:   . Fear of Current or Ex-Partner: Not on file  . Emotionally Abused: Not on file  . Physically Abused: Not on file  . Sexually Abused: Not on file     Review of Systems: General: negative for chills, fever, night sweats or weight changes.  Cardiovascular: negative for chest pain, dyspnea on exertion, edema, orthopnea, palpitations, paroxysmal nocturnal dyspnea or shortness of breath Dermatological: negative for  rash Respiratory: negative for cough or wheezing Urologic: negative for hematuria Abdominal: negative for nausea, vomiting, diarrhea, bright red blood per rectum, melena, or hematemesis Neurologic: negative for visual changes, syncope, or dizziness All other systems reviewed and are otherwise negative except as noted above.    Blood pressure 124/74, pulse 68, temperature 98.2 F (36.8 C), height 5' 10.5" (1.791 m), weight 208 lb (94.3 kg).  General appearance: alert and no distress Neck: no adenopathy, no carotid bruit, no JVD, supple, symmetrical, trachea midline and thyroid not enlarged, symmetric, no tenderness/mass/nodules Lungs: clear to auscultation bilaterally Heart: regular rate and rhythm, S1, S2 normal, no murmur, click, rub or gallop Extremities: extremities normal, atraumatic, no cyanosis or edema Pulses: 2+ and symmetric Skin: Skin color, texture, turgor normal. No rashes or lesions Neurologic: Alert and oriented X 3, normal strength and tone. Normal symmetric reflexes. Normal coordination and gait  EKG not performed today  ASSESSMENT AND PLAN:   PALPITATIONS History of palpitations improved with the addition of low-dose beta-blockade.  His monitor showed PVCs with short runs of SVT.  He is seeing a psychologist and feels that some of his arrhythmia may be stress related.  I am going to keep him on his beta-blocker fairly 6 months before we decide whether or not to wean him off it.  CHEST PAIN History of atypical chest pain with recent coronary calcium score performed 09/03/2019 which was 0.  I reassured him that his chest pain is not cardiac in nature.  Hyperlipidemia History of mild hyperlipidemia with lipid profile performed 09/03/2019 revealing cholesterol 181, LDL 112 and HDL 56.  He does admit to dietary indiscretion.  He is going to alter his diet we will recheck a lipid liver profile in 3 months.      Lorretta Harp MD FACP,FACC,FAHA, [facility] 09/10/2019 5:18  PM

## 2019-09-10 NOTE — Assessment & Plan Note (Signed)
History of mild hyperlipidemia with lipid profile performed 09/03/2019 revealing cholesterol 181, LDL 112 and HDL 56.  He does admit to dietary indiscretion.  He is going to alter his diet we will recheck a lipid liver profile in 3 months.

## 2019-09-10 NOTE — Assessment & Plan Note (Signed)
History of atypical chest pain with recent coronary calcium score performed 09/03/2019 which was 0.  I reassured him that his chest pain is not cardiac in nature.

## 2019-09-10 NOTE — Assessment & Plan Note (Signed)
History of palpitations improved with the addition of low-dose beta-blockade.  His monitor showed PVCs with short runs of SVT.  He is seeing a psychologist and feels that some of his arrhythmia may be stress related.  I am going to keep him on his beta-blocker fairly 6 months before we decide whether or not to wean him off it.

## 2019-09-11 ENCOUNTER — Encounter: Payer: Self-pay | Admitting: Physical Therapy

## 2019-09-11 ENCOUNTER — Ambulatory Visit: Payer: 59 | Attending: Urology | Admitting: Physical Therapy

## 2019-09-11 DIAGNOSIS — M25651 Stiffness of right hip, not elsewhere classified: Secondary | ICD-10-CM | POA: Insufficient documentation

## 2019-09-11 DIAGNOSIS — R293 Abnormal posture: Secondary | ICD-10-CM | POA: Diagnosis present

## 2019-09-11 DIAGNOSIS — M62838 Other muscle spasm: Secondary | ICD-10-CM | POA: Diagnosis present

## 2019-09-11 NOTE — Patient Instructions (Signed)
Access Code: [email or web address]  URL: [email or web address]  Date: 09/11/2019  Prepared by: Dwana Curd   Exercises Supine Single Knee to Chest Stretch - 5 reps - 1 sets - 10 sec hold                            - 2x daily - 7x weekly Supine Double Knee to Chest - 10 reps - 3 sets - 1x daily - 7x weekly Supine Hip Internal and External Rotation - 10 reps - 1 sets - 5 sec hold - 1x daily - 7x weekly Seated Figure 4 Piriformis Stretch - 3 reps - 1 sets - 30 hold - 1x daily - 7x weekly Seated Hamstring Stretch - 3 reps - 1 sets - 30 sec hold - 1x daily - 7x weekly Seated Diaphragmatic Breathing - 10 reps - 1 sets - 3x daily - 7x weekly Prone Hip Extension with Bent Knee - One Pillow - 10 reps - 2 sets - 1x daily - 7x weekly

## 2019-09-11 NOTE — Therapy (Addendum)
[facility] [address], [place], [place], [address] Phone: [phone]   Fax:  [phone]  Physical Therapy Treatment  Patient Details  Name: Eric Goodman MRN: [id number] Date of Birth: 1960-11-24 Referring Provider (PT): Jacalyn Lefevre D   Encounter Date: 09/11/2019  PT End of Session - 09/11/19 1617    Visit Number  2    Date for PT Re-Evaluation  10/23/19    PT Start Time  2876    PT Stop Time  1614    PT Time Calculation (min)  41 min    Activity Tolerance  Patient tolerated treatment well    Behavior During Therapy  [facility] for tasks assessed/performed       History reviewed. No pertinent past medical history.  Past Surgical History:  Procedure Laterality Date  . REPAIR OF PERFORATED ULCER     2003    There were no vitals filed for this visit.  Subjective Assessment - 09/11/19 1625    Subjective  I haven't had pain since last time I was here.  Still want to make sure the pain doesn't come back and be able to sustain an erection but I have had heart issues that prevent me from having intercourse at this time.    Patient Stated Goals  reduce pain and resolve erectile dysfunction    Currently in Pain?  No/denies                       [facility] Adult PT Treatment/Exercise - 09/11/19 0001      Neuro Re-ed    Neuro Re-ed Details   biofeedback: seated with ball squeeze, relax with stretches and towel roll under pelvis with breathing; standing contract and relax and mini squat with kegel; prone straight leg raise      Lumbar Exercises: Stretches   Active Hamstring Stretch  Right;Left;2 reps;30 seconds    Figure 4 Stretch  1 rep;30 seconds;With overpressure;Seated      Lumbar Exercises: Standing   Other Standing Lumbar Exercises  see above biofeedback - 41m contract; 365mrelax in standing      Lumbar Exercises: Seated   Other Seated Lumbar Exercises  5-51m100melax and 15-17 contract in  sitting      Lumbar Exercises: Prone   Straight Leg Raise  10 reps;3 seconds             PT Education - 09/11/19 1614    Education Details  Access Code: [email or web address]    Person(s) Educated  Patient    Methods  Explanation;Demonstration;Tactile cues;Verbal cues;Handout    Comprehension  Verbalized understanding;Returned demonstration       PT Short Term Goals - 07/31/19 1720      PT SHORT TERM GOAL #1   Title  Pt will be ind with urge to void techniques    Time  4    Period  Weeks    Status  New    [place] Date  08/28/19      PT SHORT TERM GOAL #2   Title  Pt will be ind with HEP    Time  4    Period  Weeks    Status  New    [place] Date  10/23/19      PT SHORT TERM GOAL #3   Title  pt will understand how to engage his core correctly when lifting in order to improvecore stability and reduce risk of back pain  PT Long Term Goals - 09/11/19 1532      PT LONG TERM GOAL #1   Title  Pt will report nocturia of 1x/ night or less    Baseline  not every night and maybe 1x    Status  Achieved      PT LONG TERM GOAL #2   Title  Pt will report no groin pain during typical day and able to perform all work related duties.    Status  Achieved      PT LONG TERM GOAL #3   Title  pt will report he is able to maintain an erection for duration of intercourse with his spouse    Baseline  about 15 minutes      PT LONG TERM GOAL #4   Title  Pt will have 50% improvement in urinary frequency    Baseline  30% better            Plan - 09/11/19 1614    Clinical Impression Statement  Pt did well with biofeedback today.  He had low endurance and strength in standing.  Pt had improved resting tone after doing stretches.  Pt will benefit from skilled PT to continue working on strength and endurance    Personal Factors and Comorbidities  Time since onset of injury/illness/exacerbation    PT Treatment/Interventions  ADLs/Self Care Home Management;Biofeedback;Electrical  Stimulation;Moist Heat;Cryotherapy;Therapeutic activities;Therapeutic exercise;Neuromuscular re-education;Manual techniques;Dry needling;Taping    PT Next Visit Plan  f/u on HEP; toileting techniques; biofeedback, breathe and bulge; posture with foam roll against the wall    PT Home Exercise Plan  Access Code: [email or web address]    Consulted and Agree with Plan of Care  Patient       Patient will benefit from skilled therapeutic intervention in order to improve the following deficits and impairments:  Decreased coordination, Decreased strength, Impaired flexibility, Increased fascial restricitons, Increased muscle spasms, Decreased range of motion, Pain, Postural dysfunction  Visit Diagnosis: Other muscle spasm  Abnormal posture  Stiffness of right hip, not elsewhere classified     Problem List Patient Active Problem List   Diagnosis Date Noted  . Hyperlipidemia 08/13/2019  . GERD 03/17/2010  . CHEST PAIN 03/17/2010  . PALPITATIONS 03/12/2010    Jule Ser, PT 09/11/2019, 5:23 PM  [facility] [address] [place], [place], [address] Phone: [phone]   Fax:  [phone]  Name: Eric Goodman MRN: [id number] Date of Birth: [birth year]  PHYSICAL THERAPY DISCHARGE SUMMARY  Visits from Start of Care: 2  Current functional level related to goals / functional outcomes: See above   Remaining deficits: See above   Education / Equipment: HEP  Plan: Patient agrees to discharge.  Patient goals were not met. Patient is being discharged due to not returning since the last visit.  ?????    Too much time lapsed due to cancellation policy he was discharged from PT at this time  Gustavus Bryant, PT 10/22/19 11:36 AM

## 2019-09-19 ENCOUNTER — Encounter: Payer: Self-pay | Admitting: Physical Therapy

## 2019-10-01 ENCOUNTER — Telehealth: Payer: Self-pay | Admitting: Cardiovascular Disease

## 2019-10-01 NOTE — Telephone Encounter (Signed)
Eric Goodman reports intermittent tightness under his left arm to his left nipple for about 1.5 weeks. The tightness comes and goes and nothing makes the tightness better or worse, including flexing, coughing, food.  He does not remember lifting anything heaving or hurting himself. The tightness is not associated with rest or exertion.  He states this sensation is different than described to Dr. Allyson Sabal at recent visit.  He does report he is taking his metop as prescribed and his palpitations have mostly improved. He has no other symptoms and specifically denies SOB, dizziness, lightheadedness. He had a recent calcium score of 0.   Instructed the patient to call PCP for further evaluation. He will proceed to ER if symptoms worsen or more symptoms occur.  He understands he will be called with further instructions from Dr. Allyson Sabal.

## 2019-10-01 NOTE — Telephone Encounter (Signed)
Pt c/o of Chest Pain: STAT if CP now or developed within 24 hours  1. Are you having CP right now? Yes   2. Are you experiencing any other symptoms (ex. SOB, nausea, vomiting, sweating)? No   3. How long have you been experiencing CP? Past week and a half (got worse today)  4. Is your CP continuous or coming and going? Continuous   5. Have you taken Nitroglycerin? No   Eric Goodman is calling stating he has been experiencing CP for the past week and a half, but it has worsened today. He states he does not have any symptoms besides the CP, but the pain is on his left side and runs under his arms. He states it feels like tightness with random spurts of pain. Please advise. ?

## 2019-10-02 ENCOUNTER — Encounter: Payer: Self-pay | Admitting: Physical Therapy

## 2019-10-24 ENCOUNTER — Ambulatory Visit: Payer: 59 | Admitting: Physical Therapy

## 2019-10-30 ENCOUNTER — Other Ambulatory Visit: Payer: Self-pay | Admitting: Cardiovascular Disease

## 2019-10-30 NOTE — Telephone Encounter (Signed)
*  STAT* If patient is at the pharmacy, call can be transferred to refill team.   1. Which medications need to be refilled? (please list name of each medication and dose if known) metoprolol succinate (TOPROL XL) 25 MG 24 hr tablet  2. Which pharmacy/location (including street and city if local pharmacy) is medication to be sent to? [id number] [place], [place] - [address]  3. Do they need a 30 day or 90 day supply? 90 day  

## 2019-10-31 NOTE — Telephone Encounter (Signed)
metoprolol succinate (TOPROL XL) 25 MG 24 hr tablet 90 tablet 3 08/19/2019    Sig - Route: Take 1 tablet (25 mg total) by mouth daily. - Oral   Sent to pharmacy as: metoprolol succinate (TOPROL XL) 25 MG 24 hr tablet   E-Prescribing Status: Receipt confirmed by pharmacy (08/19/2019 4:38 PM EST)   Pharmacy  [phone] [place], [place] - [address]   Med previously refilled

## 2019-11-07 ENCOUNTER — Other Ambulatory Visit: Payer: Self-pay | Admitting: Cardiovascular Disease

## 2019-11-07 MED ORDER — METOPROLOL SUCCINATE ER 25 MG PO TB24: 25.0000 mg | ORAL_TABLET | Freq: Every day | ORAL | 3 refills | Status: DC

## 2019-11-07 NOTE — Telephone Encounter (Signed)
New Message      *STAT* If patient is at the pharmacy, call can be transferred to refill team.   1. Which medications need to be refilled? (please list name of each medication and dose if known)   metoprolol succinate (TOPROL XL) 25 MG 24 hr tablet     2. Which pharmacy/location (including street and city if local pharmacy) is medication to be sent to? [phone] [place], [place] - [address]  3. Do they need a 30 day or 90 day supply? 90  Pt is out of medication

## 2020-01-28 ENCOUNTER — Other Ambulatory Visit: Payer: Self-pay

## 2020-01-28 ENCOUNTER — Telehealth: Payer: Self-pay | Admitting: Cardiovascular Disease

## 2020-01-28 MED ORDER — METOPROLOL SUCCINATE ER 25 MG PO TB24: 25.0000 mg | ORAL_TABLET | Freq: Every day | ORAL | 3 refills | Status: DC

## 2020-01-28 NOTE — Telephone Encounter (Signed)
*  STAT* If patient is at the pharmacy, call can be transferred to refill team.   1. Which medications need to be refilled? (please list name of each medication and dose if known) metoprolol succinate (TOPROL XL) 25 MG 24 hr tablet  2. Which pharmacy/location (including street and city if local pharmacy) is medication to be sent to? [phone] [place], [place] - [address]  3. Do they need a 30 day or 90 day supply? 90 day

## 2020-07-21 ENCOUNTER — Telehealth: Payer: Self-pay | Admitting: Cardiovascular Disease

## 2020-07-21 MED ORDER — METOPROLOL SUCCINATE ER 25 MG PO TB24: 25.0000 mg | ORAL_TABLET | Freq: Every day | ORAL | 3 refills | Status: DC

## 2020-07-21 NOTE — Telephone Encounter (Signed)
*  STAT* If patient is at the pharmacy, call can be transferred to refill team.   1. Which medications need to be refilled? (please list name of each medication and dose if known) metoprolol succinate (TOPROL XL) 25 MG 24 hr tablet  2. Which pharmacy/location (including street and city if local pharmacy) is medication to be sent to? [phone] [place], [place] - [address]  3. Do they need a 30 day or 90 day supply? 90 day   Patient has an appointment 09/18/2020 with Dr. Allyson Sabal

## 2020-08-04 IMAGING — CT CT CARDIAC CORONARY ARTERY CALCIUM SCORE
3 series · 14 of 20 positions shown, 15 images · non-contrast
Comparison: None.

Addendum:
:
Calcium score scan
CLINICAL DATA: Risk stratification

MEDICATIONS:
No Medications.
TECHNIQUE: The patient was scanned on a Siemens Sensation 16 slice scanner.
Axial non-contrast 3mm slices were carried out through the heart.
The data set was analyzed on a dedicated work station and scored
using the Agatson method.

[Series 2: casc 3.0 bv41 2 bestsyst 270 ms · axial · 0.36mm/px · z∈[-220,-136]mm · 4 of 48 slices shown, 5 images]
[im 10/48  vessel]
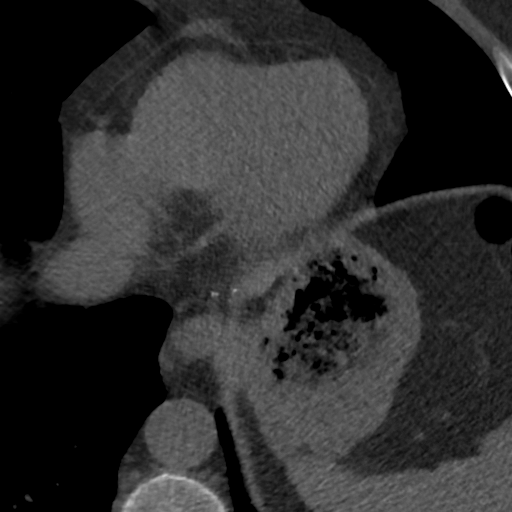
[im 10/48  lung]
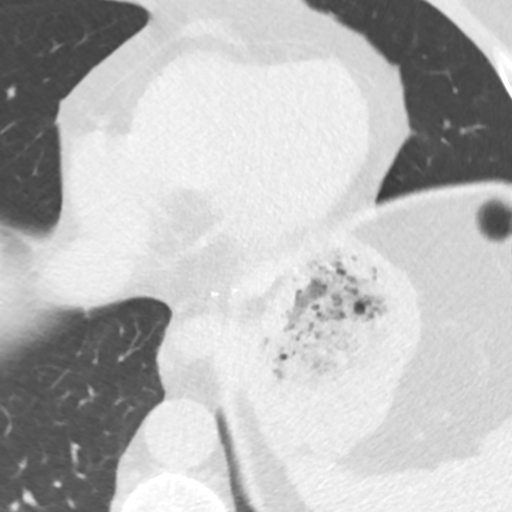
[im 19/48  vessel]
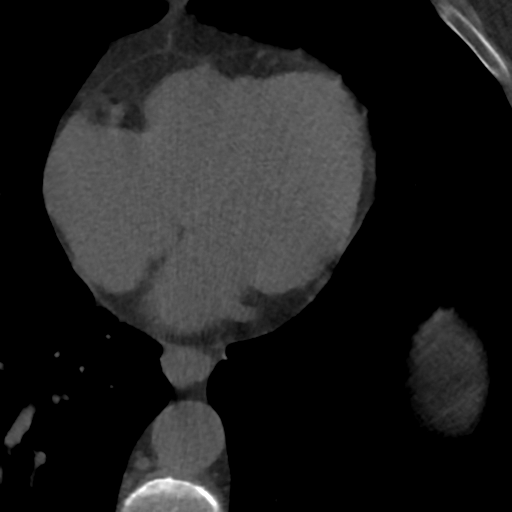
[im 29/48  vessel]
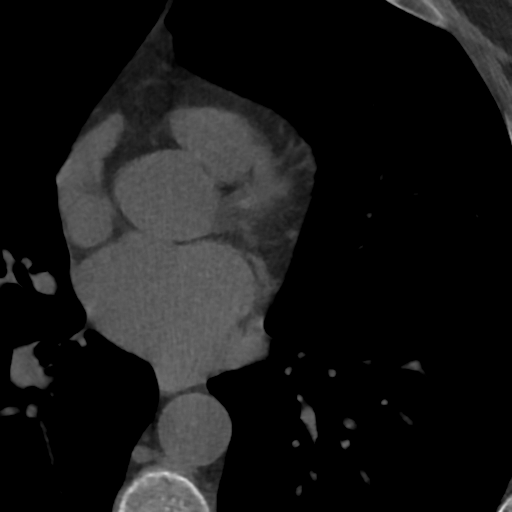
[im 38/48  vessel]
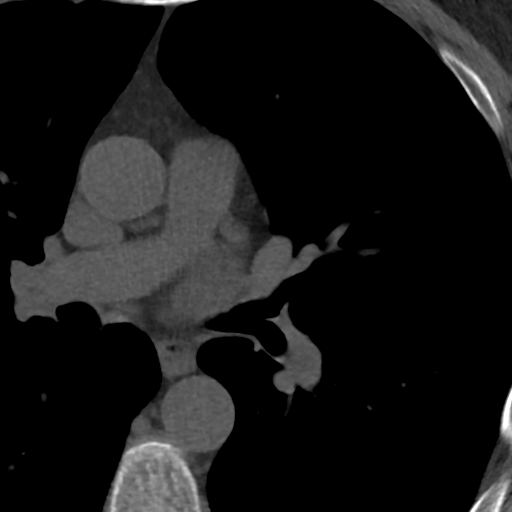

[Series 3: lung 270 ms · axial · 0.71mm/px · z∈[-226,-130]mm · 5 of 48 slices shown]
[im 8/48  lung]
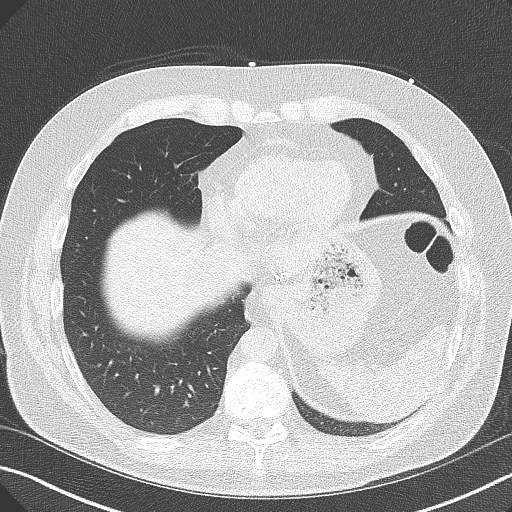
[im 16/48  lung]
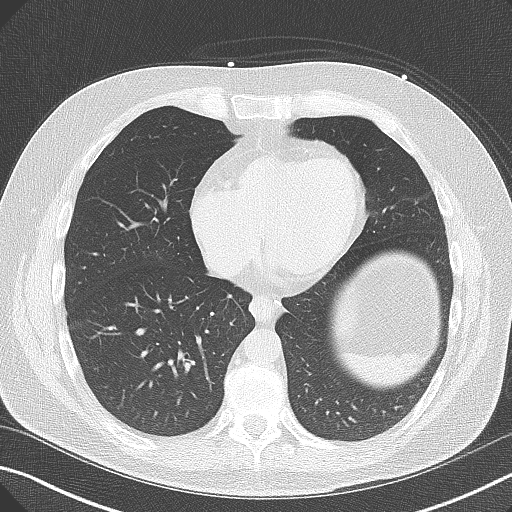
[im 24/48  lung]
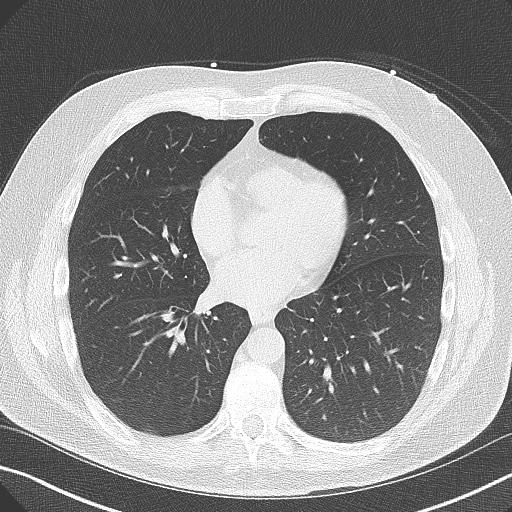
[im 32/48  lung]
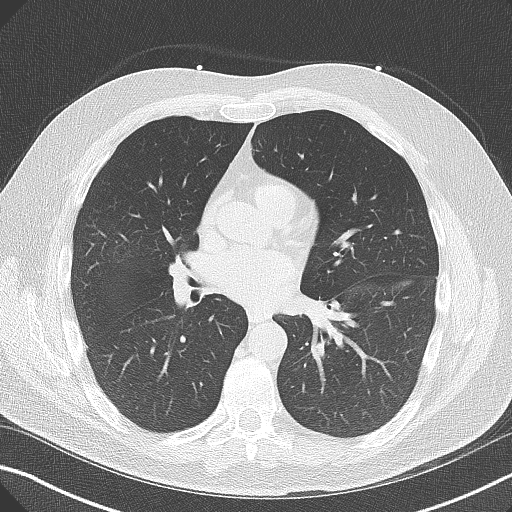
[im 40/48  lung]
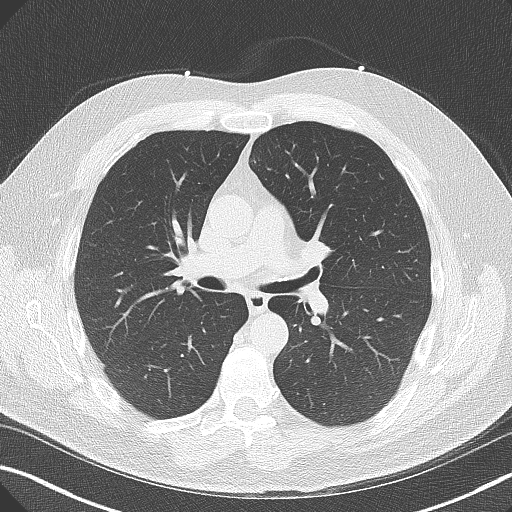

[Series 4: lung st 270 ms · axial · 0.71mm/px · z∈[-226,-130]mm · 5 of 48 slices shown]
[im 8/48  lung]
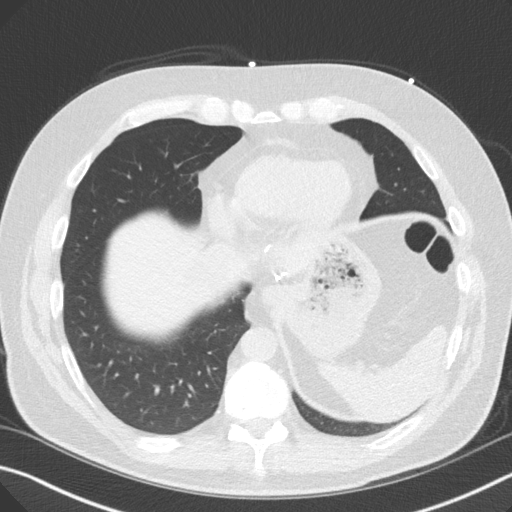
[im 16/48  lung]
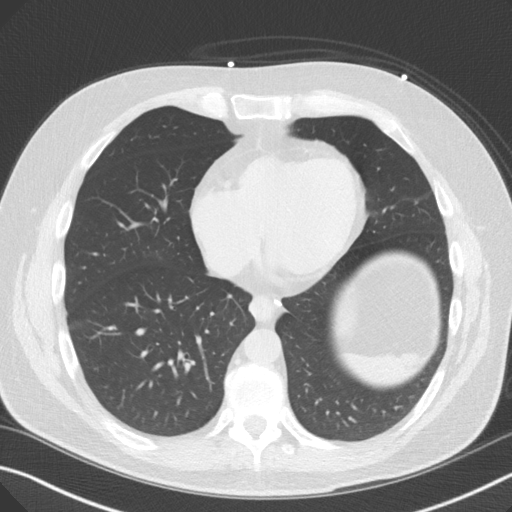
[im 24/48  lung]
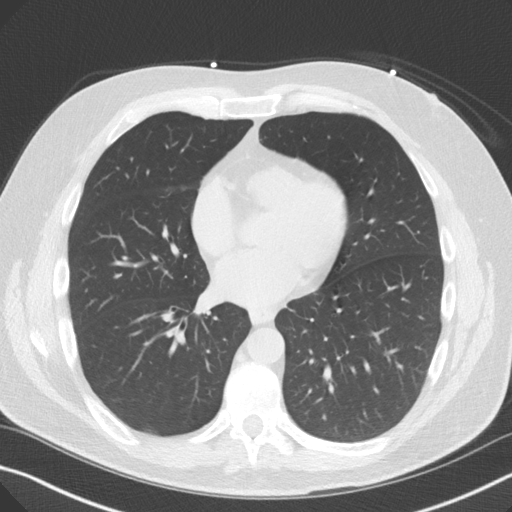
[im 32/48  lung]
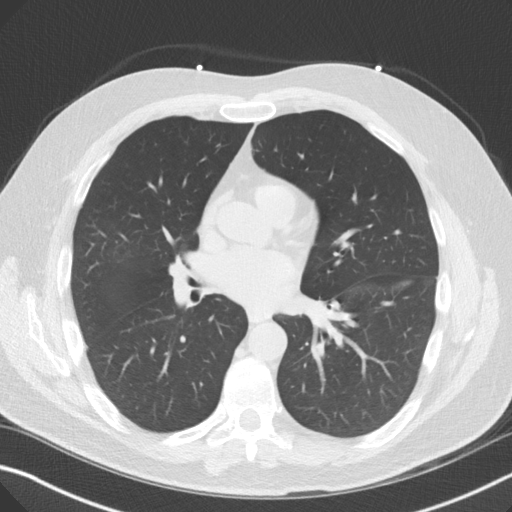
[im 40/48  lung]
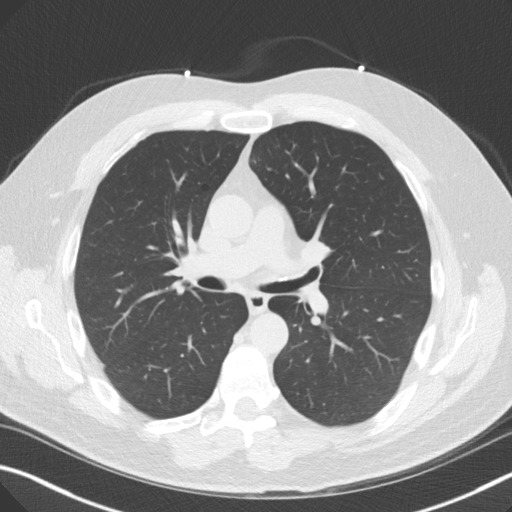

[14 of 20 positions shown; findings below may reference images not displayed]

FINDINGS: Non-cardiac: See separate report from [REDACTED].

Ascending Aorta: Normal caliber

Pericardium: Normal
IMPRESSION: Coronary calcium score of 0 Agatston units. This suggests low risk
for future cardiac events.

Mengesha Best Jaarsoo

EXAM:
OVER-READ INTERPRETATION  CT CHEST

The following report is an over-read performed by radiologist Dr.
over-read does not include interpretation of cardiac or coronary
anatomy or pathology. The coronary calcium score interpretation by
the cardiologist is attached.
FINDINGS: Cardiovascular: Normal heart size.  Mild aortic atherosclerosis.

Mediastinum/lymph nodes: No mass or adenopathy identified.

Lungs/pleura: No pleural effusion identified. No airspace
consolidation, atelectasis or pneumothorax. No suspicious nodules
identified.

Upper abdomen: Postsurgical change noted at the GE junction. No
acute abnormalities identified.

Musculoskeletal: No acute or significant osseous abnormality. Mild
degenerative disc disease within the lower thoracic spine.
IMPRESSION: 1. No significant non-cardiac abnormalities identified.

*** End of Addendum ***
:
Calcium score scan
FINDINGS: Non-cardiac: See separate report from [REDACTED].

Ascending Aorta: Normal caliber

Pericardium: Normal
IMPRESSION: Coronary calcium score of 0 Agatston units. This suggests low risk
for future cardiac events.

Mengesha Best Jaarsoo

## 2020-09-18 ENCOUNTER — Encounter: Payer: Self-pay | Admitting: Cardiovascular Disease

## 2020-09-18 ENCOUNTER — Ambulatory Visit (INDEPENDENT_AMBULATORY_CARE_PROVIDER_SITE_OTHER): Payer: 59 | Admitting: Cardiovascular Disease

## 2020-09-18 ENCOUNTER — Other Ambulatory Visit: Payer: Self-pay

## 2020-09-18 VITALS — BP 112/72 | HR 82 | Ht 70.5 in | Wt 215.0 lb

## 2020-09-18 DIAGNOSIS — R002 Palpitations: Secondary | ICD-10-CM

## 2020-09-18 DIAGNOSIS — R0789 Other chest pain: Secondary | ICD-10-CM | POA: Diagnosis not present

## 2020-09-18 DIAGNOSIS — E782 Mixed hyperlipidemia: Secondary | ICD-10-CM | POA: Diagnosis not present

## 2020-09-18 NOTE — Assessment & Plan Note (Signed)
History of palpitations in the past with event monitor showing PVCs with short runs of SVT.  I did begin him on a low-dose beta-blocker which resulted in marked improvement.  He also saw a psychologist for stress management which improved his palpitations as well.  He no longer complains of palpitations.

## 2020-09-18 NOTE — Patient Instructions (Signed)
Medication Instructions:  Your physician recommends that you continue on your current medications as directed. Please refer to the Current Medication list given to you today.  *If you need a refill on your cardiac medications before your next appointment, please call your pharmacy*   Follow-Up: At [facility], you and your health needs are our priority.  As part of our continuing mission to provide you with exceptional heart care, we have created designated Provider Care Teams.  These Care Teams include your primary Cardiologist (physician) and Advanced Practice Providers (APPs -  Physician Assistants and Nurse Practitioners) who all work together to provide you with the care you need, when you need it.  We recommend signing up for the patient portal called "MyChart".  Sign up information is provided on this After Visit Summary.  MyChart is used to connect with patients for Virtual Visits (Telemedicine).  Patients are able to view lab/test results, encounter notes, upcoming appointments, etc.  Non-urgent messages can be sent to your provider as well.   To learn more about what you can do with MyChart, go to [email or web address].    Your next appointment:   12 month(s)  The format for your next appointment:   In Person  Provider:   Nanetta Batty, MD   Other Instructions  Heart-Healthy Eating Plan Heart-healthy meal planning includes:  Eating less unhealthy fats.  Eating more healthy fats.  Making other changes in your diet. Talk with your doctor or a diet specialist (dietitian) to create an eating plan that is right for you. What are tips for following this plan? Cooking Avoid frying your food. Try to bake, boil, grill, or broil it instead. You can also reduce fat by:  Removing the skin from poultry.  Removing all visible fats from meats.  Steaming vegetables in water or broth. Meal planning  At meals, divide your plate into four equal parts: ? Fill one-half of your  plate with vegetables and green salads. ? Fill one-fourth of your plate with whole grains. ? Fill one-fourth of your plate with lean protein foods.  Eat 4-5 servings of vegetables per day. A serving of vegetables is: ? 1 cup of raw or cooked vegetables. ? 2 cups of raw leafy greens.  Eat 4-5 servings of fruit per day. A serving of fruit is: ? 1 medium whole fruit. ?  cup of dried fruit. ?  cup of fresh, frozen, or canned fruit. ?  cup of 100% fruit juice.  Eat more foods that have soluble fiber. These are apples, broccoli, carrots, beans, peas, and barley. Try to get 20-30 g of fiber per day.  Eat 4-5 servings of nuts, legumes, and seeds per week: ? 1 serving of dried beans or legumes equals  cup after being cooked. ? 1 serving of nuts is  cup. ? 1 serving of seeds equals 1 tablespoon.   General information  Eat more home-cooked food. Eat less restaurant, buffet, and fast food.  Limit or avoid alcohol.  Limit foods that are high in starch and sugar.  Avoid fried foods.  Lose weight if you are overweight.  Keep track of how much salt (sodium) you eat. This is important if you have high blood pressure. Ask your doctor to tell you more about this.  Try to add vegetarian meals each week. Fats  Choose healthy fats. These include olive oil and canola oil, flaxseeds, walnuts, almonds, and seeds.  Eat more omega-3 fats. These include salmon, mackerel, sardines, tuna, flaxseed oil, and ground  flaxseeds. Try to eat fish at least 2 times each week.  Check food labels. Avoid foods with trans fats or high amounts of saturated fat.  Limit saturated fats. ? These are often found in animal products, such as meats, butter, and cream. ? These are also found in plant foods, such as palm oil, palm kernel oil, and coconut oil.  Avoid foods with partially hydrogenated oils in them. These have trans fats. Examples are stick margarine, some tub margarines, cookies, crackers, and other  baked goods. What foods can I eat? Fruits All fresh, canned (in natural juice), or frozen fruits. Vegetables Fresh or frozen vegetables (raw, steamed, roasted, or grilled). Green salads. Grains Most grains. Choose whole wheat and whole grains most of the time. Rice and pasta, including brown rice and pastas made with whole wheat. Meats and other proteins Lean, well-trimmed beef, veal, pork, and lamb. Chicken and [place] without skin. All fish and shellfish. Wild duck, rabbit, pheasant, and venison. Egg whites or low-cholesterol egg substitutes. Dried beans, peas, lentils, and tofu. Seeds and most nuts. Dairy Low-fat or nonfat cheeses, including ricotta and mozzarella. Skim or 1% milk that is liquid, powdered, or evaporated. Buttermilk that is made with low-fat milk. Nonfat or low-fat yogurt. Fats and oils Non-hydrogenated (trans-free) margarines. Vegetable oils, including soybean, sesame, sunflower, olive, peanut, safflower, corn, canola, and cottonseed. Salad dressings or mayonnaise made with a vegetable oil. Beverages Mineral water. Coffee and tea. Diet carbonated beverages. Sweets and desserts Sherbet, gelatin, and fruit ice. Small amounts of dark chocolate. Limit all sweets and desserts. Seasonings and condiments All seasonings and condiments. The items listed above may not be a complete list of foods and drinks you can eat. Contact a dietitian for more options. What foods should I avoid? Fruits Canned fruit in heavy syrup. Fruit in cream or butter sauce. Fried fruit. Limit coconut. Vegetables Vegetables cooked in cheese, cream, or butter sauce. Fried vegetables. Grains Breads that are made with saturated or trans fats, oils, or whole milk. Croissants. Sweet rolls. Donuts. High-fat crackers, such as cheese crackers. Meats and other proteins Fatty meats, such as hot dogs, ribs, sausage, bacon, rib-eye roast or steak. High-fat deli meats, such as salami and bologna. Caviar. Domestic  duck and goose. Organ meats, such as liver. Dairy Cream, sour cream, cream cheese, and creamed cottage cheese. Whole-milk cheeses. Whole or 2% milk that is liquid, evaporated, or condensed. Whole buttermilk. Cream sauce or high-fat cheese sauce. Yogurt that is made from whole milk. Fats and oils Meat fat, or shortening. Cocoa butter, hydrogenated oils, palm oil, coconut oil, palm kernel oil. Solid fats and shortenings, including bacon fat, salt pork, lard, and butter. Nondairy cream substitutes. Salad dressings with cheese or sour cream. Beverages Regular sodas and juice drinks with added sugar. Sweets and desserts Frosting. Pudding. Cookies. Cakes. Pies. Milk chocolate or white chocolate. Buttered syrups. Full-fat ice cream or ice cream drinks. The items listed above may not be a complete list of foods and drinks to avoid. Contact a dietitian for more information. Summary  Heart-healthy meal planning includes eating less unhealthy fats, eating more healthy fats, and making other changes in your diet.  Eat a balanced diet. This includes fruits and vegetables, low-fat or nonfat dairy, lean protein, nuts and legumes, whole grains, and heart-healthy oils and fats. This information is not intended to replace advice given to you by your health care provider. Make sure you discuss any questions you have with your health care provider. Document Revised: 08/31/2017 Document  Reviewed: 08/04/2017 Elsevier Patient Education  2021 [employer].

## 2020-09-18 NOTE — Assessment & Plan Note (Signed)
History of atypical chest pain in the past with coronary calcium score performed 09/03/2019 which which was 0.  He no longer has chest pain.

## 2020-09-18 NOTE — Progress Notes (Signed)
09/18/2020 Florinda Marker   11-26-1960  [id number]  Primary Physician Deatra James, MD Primary Cardiologist: Runell Gess MD Milagros Loll, [place], [place]  HPI:  Eric Goodman is a 60 y.o.  mildly overweight married Caucasian male with no children referred by Dr.Sunfor cardiovascular valuation because of palpitation and atypical chest pain.  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 He has seen Dr. Willa Rough, cardiologist, 10 years ago for evaluation of palpitations, atypical chest pain and hyperlipidemia. He did have a negative stress echo at that time. He has no other cardiac risk factors other than mild untreated hyperlipidemia. His father apparently had an ICD was taken care of by Dr. Alanda Amass as mother may have had stents as well. He has been fairly asymptomatic until several weeks ago when he had recurrence of his palpitations. He was recently seen in the emergency room 08/07/2019 where he was in sinus rhythm his work-up was unremarkable.  I got an event monitor which showed PVCs with short runs of SVT and a coronary calcium score performed 09/03/2019 which was 0.  I did start him on low-dose beta-blockade which was resulted in marked improvement in his palpitations.  He is also seeing a psychologist to manage stress which may also have an impact on his palpitations.  He has atypical chest pain which I have reassured him is noncardiac.  Since I saw him a year ago he is done well.  Unfortunately his father-in-law is ill which is caused some stress in the family.  He is no longer complaining of palpitations nor is he complaining of chest pain.   Current Meds  Medication Sig  . fluticasone (FLONASE) 50 MCG/ACT nasal spray Place 2 sprays into both nostrils daily.  Marland Kitchen LORazepam (ATIVAN) 0.5 MG tablet Take 0.5 mg by mouth every 8 (eight) hours.  . metoprolol succinate (TOPROL XL) 25 MG 24 hr tablet Take 1 tablet (25 mg total) by mouth daily.   Marland Kitchen omeprazole (PRILOSEC) 20 MG capsule Take 20 mg by mouth daily.  . sertraline (ZOLOFT) 50 MG tablet Take 100 mg by mouth daily.   . tamsulosin (FLOMAX) 0.4 MG CAPS capsule Take 0.4 mg by mouth at bedtime.     Allergies  Allergen Reactions  . Aspirin     Social History   Socioeconomic History  . Marital status: Married    Spouse name: Not on file  . Number of children: Not on file  . Years of education: Not on file  . Highest education level: Not on file  Occupational History  . Not on file  Tobacco Use  . Smoking status: Never Smoker  . Smokeless tobacco: Current User  Substance and Sexual Activity  . Alcohol use: Not on file  . Drug use: Not on file  . Sexual activity: Not on file  Other Topics Concern  . Not on file  Social History Narrative  . Not on file   Social Determinants of Health   Financial Resource Strain: Not on file  Food Insecurity: Not on file  Transportation Needs: Not on file  Physical Activity: Not on file  Stress: Not on file  Social Connections: Not on file  Intimate Partner Violence: Not on file     Review of Systems: General: negative for chills, fever, night sweats or weight changes.  Cardiovascular: negative for chest pain, dyspnea on exertion, edema, orthopnea, palpitations, paroxysmal nocturnal dyspnea or shortness of breath Dermatological: negative for  rash Respiratory: negative for cough or wheezing Urologic: negative for hematuria Abdominal: negative for nausea, vomiting, diarrhea, bright red blood per rectum, melena, or hematemesis Neurologic: negative for visual changes, syncope, or dizziness All other systems reviewed and are otherwise negative except as noted above.    Blood pressure 112/72, pulse 82, height 5' 10.5" (1.791 m), weight 215 lb (97.5 kg).  General appearance: alert and no distress Neck: no adenopathy, no carotid bruit, no JVD, supple, symmetrical, trachea midline and thyroid not enlarged, symmetric, no  tenderness/mass/nodules Lungs: clear to auscultation bilaterally Heart: regular rate and rhythm, S1, S2 normal, no murmur, click, rub or gallop Extremities: extremities normal, atraumatic, no cyanosis or edema Pulses: 2+ and symmetric Skin: Skin color, texture, turgor normal. No rashes or lesions Neurologic: Alert and oriented X 3, normal strength and tone. Normal symmetric reflexes. Normal coordination and gait  EKG sinus rhythm at 82 without ST or T wave changes.  I personally reviewed this EKG.  ASSESSMENT AND PLAN:   PALPITATIONS History of palpitations in the past with event monitor showing PVCs with short runs of SVT.  I did begin him on a low-dose beta-blocker which resulted in marked improvement.  He also saw a psychologist for stress management which improved his palpitations as well.  He no longer complains of palpitations.  CHEST PAIN History of atypical chest pain in the past with coronary calcium score performed 09/03/2019 which which was 0.  He no longer has chest pain.  Hyperlipidemia History of mild hyperlipidemia with lipid profile performed 08/14/2019 revealing total cholesterol 181, LDL of 112 and HDL of 56.  I am going to provide him with a heart healthy diet.  Given his coronary calcium score is 0, I do not feel compelled to start him on a statin drug at this time.      Runell Gess MD FACP,FACC,FAHA, [facility] 09/18/2020 3:48 PM

## 2020-09-18 NOTE — Assessment & Plan Note (Signed)
History of mild hyperlipidemia with lipid profile performed 08/14/2019 revealing total cholesterol 181, LDL of 112 and HDL of 56.  I am going to provide him with a heart healthy diet.  Given his coronary calcium score is 0, I do not feel compelled to start him on a statin drug at this time.

## 2021-08-06 ENCOUNTER — Other Ambulatory Visit: Payer: Self-pay | Admitting: Cardiovascular Disease

## 2021-09-06 ENCOUNTER — Other Ambulatory Visit: Payer: Self-pay

## 2021-09-06 ENCOUNTER — Ambulatory Visit (INDEPENDENT_AMBULATORY_CARE_PROVIDER_SITE_OTHER): Payer: 59 | Admitting: Podiatry

## 2021-09-06 ENCOUNTER — Ambulatory Visit (INDEPENDENT_AMBULATORY_CARE_PROVIDER_SITE_OTHER): Payer: 59

## 2021-09-06 DIAGNOSIS — M2142 Flat foot [pes planus] (acquired), left foot: Secondary | ICD-10-CM | POA: Diagnosis not present

## 2021-09-06 DIAGNOSIS — M2141 Flat foot [pes planus] (acquired), right foot: Secondary | ICD-10-CM

## 2021-09-13 ENCOUNTER — Other Ambulatory Visit: Payer: 59

## 2021-09-13 NOTE — Progress Notes (Signed)
° °  Subjective:  61 y.o. male presenting today as a new patient for evaluation of bilateral flatfoot which is becoming painful.  Patient states that he has constant pain in the feet.  He uses OTC insoles with not much improvement.  This is been ongoing for several years.  He denies a history of injury.  No past medical history on file.  Past Surgical History:  Procedure Laterality Date   REPAIR OF PERFORATED ULCER     2003   Allergies  Allergen Reactions   Aspirin     Objective/Physical Exam General: The patient is alert and oriented x3 in no acute distress.  Dermatology: Skin is warm, dry and supple bilateral lower extremities. Negative for open lesions or macerations.  Vascular: Palpable pedal pulses bilaterally. No edema or erythema noted. Capillary refill within normal limits.  Neurological: Epicritic and protective threshold grossly intact bilaterally.   Musculoskeletal Exam: Range of motion within normal limits to all pedal and ankle joints bilateral. Muscle strength 5/5 in all groups bilateral.  Upon weightbearing there is a medial longitudinal arch collapse bilaterally. Remove foot valgus noted to the bilateral lower extremities with excessive pronation upon mid stance.  Radiographic Exam:  Normal osseous mineralization. Joint spaces preserved. No fracture/dislocation/boney destruction.   Pes planus noted on radiographic exam lateral views. Decreased calcaneal inclination and metatarsal declination angle is noted. Anterior break in the cyma line noted on lateral views. Medial talar head to deviation noted on AP radiograph.   Assessment: 1. pes planus bilateral   Plan of Care:  1. Patient was evaluated. X-Rays reviewed.  2.  Continue meloxicam 15 mg/day as per neurology 3.  Appointment with Pedorthist for custom molded orthotics 4.  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 Felecia Shelling,  DPM    [address]                                        [place], [place] [address]                Office [phone]  Fax [phone]

## 2021-10-06 ENCOUNTER — Encounter: Payer: Self-pay | Admitting: Cardiovascular Disease

## 2021-10-06 ENCOUNTER — Other Ambulatory Visit: Payer: Self-pay

## 2021-10-06 ENCOUNTER — Ambulatory Visit (INDEPENDENT_AMBULATORY_CARE_PROVIDER_SITE_OTHER): Payer: 59 | Admitting: Cardiovascular Disease

## 2021-10-06 DIAGNOSIS — R0789 Other chest pain: Secondary | ICD-10-CM | POA: Diagnosis not present

## 2021-10-06 DIAGNOSIS — R002 Palpitations: Secondary | ICD-10-CM

## 2021-10-06 DIAGNOSIS — E782 Mixed hyperlipidemia: Secondary | ICD-10-CM

## 2021-10-06 NOTE — Assessment & Plan Note (Signed)
History of hyperlipidemia not on statin therapy followed by his PCP 

## 2021-10-06 NOTE — Patient Instructions (Signed)

## 2021-10-06 NOTE — Progress Notes (Signed)
? ? ? ?10/06/2021 ?MICA RELEFORD   ?10/25/1960  ?[id number] ? ?Primary Physician Deatra James, MD ?Primary Cardiologist: Runell Gess MD Nicholes [place]am[place], [place] ? ?HPI:  Eric Goodman is a 61 y.o.  mildly overweight married Caucasian male with no children referred by Dr. Wynelle Link for cardiovascular valuation because of palpitation and atypical chest pain.  I last saw him in the office 09/18/2020. 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  He has seen Dr. Willa Rough, cardiologist, 10 years ago for evaluation of palpitations, atypical chest pain and hyperlipidemia.  He did have a negative stress echo at that time.  He has no other cardiac risk factors other than mild untreated hyperlipidemia.  His father apparently had an ICD was taken care of by Dr. Alanda Amass as mother may have had stents as well.  He has been fairly asymptomatic until several weeks ago when he had recurrence of his palpitations.  He was recently seen in the emergency room 08/07/2019 where he was in sinus rhythm his work-up was unremarkable. ?  ?I got an event monitor which showed PVCs with short runs of SVT and a coronary calcium score performed 09/03/2019 which was 0.  I did start him on low-dose beta-blockade which was resulted in marked improvement in his palpitations.  He is also seeing a psychologist to manage stress which may also have an impact on his palpitations.  He has atypical chest pain which I have reassured him is noncardiac. ?  ?Since I saw him a year ago he is done well.  Unfortunately his father-in-law is still ill which is caused some stress in the family.  He is no longer complaining of palpitations and has complained of infrequent atypical subxiphoid chest pain.  He also complains of having sensation of coldness in his feet although he has palpable pedal pulses and denies claudication. ? ? ?Current Meds  ?Medication Sig  ? fluticasone (FLONASE) 50 MCG/ACT nasal spray Place 2 sprays into both nostrils daily.  ? LORazepam (ATIVAN)  0.5 MG tablet Take 0.5 mg by mouth every 8 (eight) hours.  ? metoprolol succinate (TOPROL-XL) 25 MG 24 hr tablet TAKE 1 TABLET (25 MG TOTAL) BY MOUTH DAILY.  ? omeprazole (PRILOSEC) 20 MG capsule Take 20 mg by mouth daily.  ? sertraline (ZOLOFT) 50 MG tablet Take 100 mg by mouth daily.   ? tamsulosin (FLOMAX) 0.4 MG CAPS capsule Take 0.4 mg by mouth at bedtime.  ?  ? ?Allergies  ?Allergen Reactions  ? Aspirin   ? ? ?Social History  ? ?Socioeconomic History  ? Marital status: Married  ?  Spouse name: Not on file  ? Number of children: Not on file  ? Years of education: Not on file  ? Highest education level: Not on file  ?Occupational History  ? Not on file  ?Tobacco Use  ? Smoking status: Never  ? Smokeless tobacco: Current  ?Substance and Sexual Activity  ? Alcohol use: Not on file  ? Drug use: Not on file  ? Sexual activity: Not on file  ?Other Topics Concern  ? Not on file  ?Social History Narrative  ? Not on file  ? ?Social Determinants of Health  ? ?Financial Resource Strain: Not on file  ?Food Insecurity: Not on file  ?Transportation Needs: Not on file  ?Physical Activity: Not on file  ?Stress: Not on file  ?Social Connections: Not on file  ?Intimate Partner Violence: Not on file  ?  ? ?Review of Systems: ?  General: negative for chills, fever, night sweats or weight changes.  ?Cardiovascular: negative for chest pain, dyspnea on exertion, edema, orthopnea, palpitations, paroxysmal nocturnal dyspnea or shortness of breath ?Dermatological: negative for rash ?Respiratory: negative for cough or wheezing ?Urologic: negative for hematuria ?Abdominal: negative for nausea, vomiting, diarrhea, bright red blood per rectum, melena, or hematemesis ?Neurologic: negative for visual changes, syncope, or dizziness ?All other systems reviewed and are otherwise negative except as noted above. ? ? ? ?Blood pressure (!) 146/90, pulse 74, height 5\' 10"  (1.778 m), weight 222 lb (100.7 kg), SpO2 97 %.  ?General appearance: alert and  no distress ?Neck: no adenopathy, no carotid bruit, no JVD, supple, symmetrical, trachea midline, and thyroid not enlarged, symmetric, no tenderness/mass/nodules ?Lungs: clear to auscultation bilaterally ?Heart: regular rate and rhythm, S1, S2 normal, no murmur, click, rub or gallop ?Extremities: extremities normal, atraumatic, no cyanosis or edema ?Pulses: 2+ and symmetric ?Skin: Skin color, texture, turgor normal. No rashes or lesions ?Neurologic: Grossly normal ? ?EKG sinus rhythm at 74 without ST or T wave changes.  Personally reviewed this EKG. ? ?ASSESSMENT AND PLAN:  ? ?PALPITATIONS ?History of palpitations in the past shown to be PVCs and short runs of SVT on monitor improved with low-dose beta-blocker. ? ?CHEST PAIN ?History of atypical chest pain in the past with a coronary calcium score performed 09/03/2019 which was 0.  These episodes are much less frequent. ? ?Hyperlipidemia ?History of hyperlipidemia not on statin therapy followed by his PCP. ? ? ? ? ?09/05/2019 MD FACP,FACC,FAHA, FSCAI ?10/06/2021 ?9:33 AM ?

## 2021-10-06 NOTE — Assessment & Plan Note (Signed)
History of palpitations in the past shown to be PVCs and short runs of SVT on monitor improved with low-dose beta-blocker. ?

## 2021-10-06 NOTE — Assessment & Plan Note (Signed)
History of atypical chest pain in the past with a coronary calcium score performed 09/03/2019 which was 0.  These episodes are much less frequent. ?

## 2021-11-07 ENCOUNTER — Other Ambulatory Visit: Payer: Self-pay | Admitting: Cardiovascular Disease

## 2022-11-13 ENCOUNTER — Other Ambulatory Visit: Payer: Self-pay | Admitting: Cardiovascular Disease

## 2023-02-14 ENCOUNTER — Other Ambulatory Visit: Payer: Self-pay | Admitting: Cardiovascular Disease

## 2023-02-15 ENCOUNTER — Encounter (HOSPITAL_BASED_OUTPATIENT_CLINIC_OR_DEPARTMENT_OTHER): Payer: Self-pay | Admitting: Emergency Medicine

## 2023-02-15 ENCOUNTER — Emergency Department (HOSPITAL_BASED_OUTPATIENT_CLINIC_OR_DEPARTMENT_OTHER): Payer: 59

## 2023-02-15 ENCOUNTER — Emergency Department (HOSPITAL_BASED_OUTPATIENT_CLINIC_OR_DEPARTMENT_OTHER)
Admission: EM | Admit: 2023-02-15 | Discharge: 2023-02-15 | Disposition: A | Discharge status: Home / Self Care | Payer: Worker's Compensation | Attending: Emergency Medicine | Admitting: Emergency Medicine

## 2023-02-15 ENCOUNTER — Other Ambulatory Visit: Payer: Self-pay

## 2023-02-15 DIAGNOSIS — I10 Essential (primary) hypertension: Secondary | ICD-10-CM | POA: Diagnosis not present

## 2023-02-15 DIAGNOSIS — W1789XA Other fall from one level to another, initial encounter: Secondary | ICD-10-CM | POA: Insufficient documentation

## 2023-02-15 DIAGNOSIS — S22068A Other fracture of T7-T8 thoracic vertebra, initial encounter for closed fracture: Secondary | ICD-10-CM | POA: Diagnosis not present

## 2023-02-15 DIAGNOSIS — S22058A Other fracture of T5-T6 vertebra, initial encounter for closed fracture: Secondary | ICD-10-CM | POA: Insufficient documentation

## 2023-02-15 DIAGNOSIS — S22048A Other fracture of fourth thoracic vertebra, initial encounter for closed fracture: Secondary | ICD-10-CM | POA: Diagnosis not present

## 2023-02-15 DIAGNOSIS — S22008A Other fracture of unspecified thoracic vertebra, initial encounter for closed fracture: Secondary | ICD-10-CM

## 2023-02-15 DIAGNOSIS — Z79899 Other long term (current) drug therapy: Secondary | ICD-10-CM | POA: Insufficient documentation

## 2023-02-15 DIAGNOSIS — Y990 Civilian activity done for income or pay: Secondary | ICD-10-CM | POA: Insufficient documentation

## 2023-02-15 DIAGNOSIS — M546 Pain in thoracic spine: Secondary | ICD-10-CM | POA: Diagnosis present

## 2023-02-15 MED ORDER — KETOROLAC TROMETHAMINE 30 MG/ML IJ SOLN
30.0000 mg | Freq: Once | INTRAMUSCULAR | Status: AC
  Administered 2023-02-15: 30 mg via INTRAMUSCULAR
  Filled 2023-02-15: qty 1

## 2023-02-15 MED ORDER — IBUPROFEN 600 MG PO TABS: 600.0000 mg | ORAL_TABLET | Freq: Four times a day (QID) | ORAL | 0 refills | Status: AC | PRN

## 2023-02-15 MED ORDER — HYDROCODONE-ACETAMINOPHEN 5-325 MG PO TABS
1.0000 | ORAL_TABLET | ORAL | 0 refills | Status: AC | PRN
Start: 2023-02-15 — End: ?

## 2023-02-15 NOTE — ED Provider Triage Note (Signed)
Emergency Medicine Provider Triage Evaluation Note  Eric Goodman , a 62 y.o. male  was evaluated in triage.  Pt complains of back pain after a fall.  [place]  He fell off of a trailer flush onto his mid back earlier today.  He was sent for CT imaging which demonstrates several spinous process fractures.  No chest pain or shortness of breath.  No abdominal pain.  No lower extremity weakness or pain.  Review of Systems  Positive: Back pain Negative: Headache, neck pain  Physical Exam  BP (!) 152/88 (BP Location: Right Arm)   Pulse 81   Temp 98.4 F (36.9 C) (Oral)   Resp 18   Ht 5\' 10"  (1.778 m)   Wt 97.5 kg   SpO2 100%   BMI 30.85 kg/m  Gen:   Awake, no distress   Resp:  Normal effort  MSK:   Moves extremities without difficulty  Other:  Tenderness over the mid thoracic spine, consistent with spinous process fractures.  Medical Decision Making  Medically screening exam initiated at 5:04 PM.  Appropriate orders placed.  Florinda Marker was informed that the remainder of the evaluation will be completed by another provider, this initial triage assessment does not replace that evaluation, and the importance of remaining in the ED until their evaluation is complete.     Renne Crigler, PA-C 02/15/23 [phone]

## 2023-02-15 NOTE — Progress Notes (Signed)
Orthopedic Tech Progress Note Patient Details:  Eric Goodman 1960/11/19 [id number] TLSO Brace has been ordered from [facility]  Patient ID: Eric Goodman, male   DOB: May 05, 1961, 62 y.o.   MRN: [id number]  [name] 02/15/2023, 6:44 PM

## 2023-02-15 NOTE — ED Triage Notes (Signed)
Pt via pov from UC after a fall at work. UC sent pt for ct scan of his back - xray was negative. Pt reports his foot slipped and he fell, injuring his back. Endorses hitting his head, denies loc or dizziness since then. Pt reports the pain is in his thoracic spine. Pt alert & oriented, nad noted.

## 2023-02-15 NOTE — ED Notes (Signed)
Pt verbalized understanding of d/c instructions, meds, and followup care. Denies questions. VSS, no distress noted. Steady gait to exit with all belongings. Delay for Ortho TLSO brace.

## 2023-02-15 NOTE — ED Provider Notes (Signed)
[place] EMERGENCY DEPARTMENT AT [facility] Provider Note   CSN: [id number] Arrival date & time: 02/15/23  1404     History  Chief Complaint  Patient presents with   Back Pain    Eric Goodman is a 62 y.o. male.  Pt is a 62 yo male with pmhx significant for htn, gerd, and depression.  Pt was at work today and injured his back after a fall from his truck.  He denies any head injury.  Pain is localized to his thoracic spine.  He is ambulatory.       Home Medications Prior to Admission medications   Medication Sig Start Date End Date Taking? Authorizing Provider  HYDROcodone-acetaminophen (NORCO/VICODIN) 5-325 MG tablet Take 1 tablet by mouth every 4 (four) hours as needed. 02/15/23  Yes Jacalyn Lefevre, MD  ibuprofen (ADVIL) 600 MG tablet Take 1 tablet (600 mg total) by mouth every 6 (six) hours as needed. 02/15/23  Yes Jacalyn Lefevre, MD  fluticasone (FLONASE) 50 MCG/ACT nasal spray Place 2 sprays into both nostrils daily.    [provider]  LORazepam (ATIVAN) 0.5 MG tablet Take 0.5 mg by mouth every 8 (eight) hours.    [provider]  metoprolol succinate (TOPROL-XL) 25 MG 24 hr tablet TAKE 1 TABLET (25 MG TOTAL) BY MOUTH DAILY. NEED OV. 02/14/23   Runell Gess, MD  omeprazole (PRILOSEC) 20 MG capsule Take 20 mg by mouth daily.    [provider]  sertraline (ZOLOFT) 50 MG tablet Take 100 mg by mouth daily.     [provider]  tamsulosin (FLOMAX) 0.4 MG CAPS capsule Take 0.4 mg by mouth at bedtime. 07/10/19   [provider]      Allergies    Aspirin    Review of Systems   Review of Systems  Musculoskeletal:  Positive for back pain.  All other systems reviewed and are negative.   Physical Exam Updated Vital Signs BP (!) 152/88 (BP Location: Right Arm)   Pulse 81   Temp 98.4 F (36.9 C) (Oral)   Resp 18   Ht 5\' 10"  (1.778 m)   Wt 97.5 kg   SpO2 100%   BMI 30.85 kg/m  Physical Exam Vitals and nursing  note reviewed.  Constitutional:      Appearance: Normal appearance.  HENT:     Head: Normocephalic and atraumatic.     Right Ear: External ear normal.     Left Ear: External ear normal.     Nose: Nose normal.     Mouth/Throat:     Mouth: Mucous membranes are moist.     Pharynx: Oropharynx is clear.  Eyes:     Extraocular Movements: Extraocular movements intact.     Conjunctiva/sclera: Conjunctivae normal.     Pupils: Pupils are equal, round, and reactive to light.  Cardiovascular:     Rate and Rhythm: Normal rate and regular rhythm.     Pulses: Normal pulses.     Heart sounds: Normal heart sounds.  Pulmonary:     Effort: Pulmonary effort is normal.     Breath sounds: Normal breath sounds.  Abdominal:     General: Abdomen is flat. Bowel sounds are normal.     Palpations: Abdomen is soft.  Musculoskeletal:     Cervical back: Normal range of motion and neck supple.       Back:  Skin:    General: Skin is warm.     Capillary Refill: Capillary refill takes less  than 2 seconds.  Neurological:     General: No focal deficit present.     Mental Status: He is alert and oriented to person, place, and time.  Psychiatric:        Mood and Affect: Mood normal.        Behavior: Behavior normal.     ED Results / Procedures / Treatments   Labs (all labs ordered are listed, but only abnormal results are displayed) Labs Reviewed - No data to display  EKG None  Radiology CT Thoracic Spine Wo Contrast  Result Date: 02/15/2023 CLINICAL DATA:  Back trauma, no prior imaging (Age >= 16y). Thoracic back pain after a fall. EXAM: CT THORACIC SPINE WITHOUT CONTRAST TECHNIQUE: Multidetector CT images of the thoracic were obtained using the standard protocol without intravenous contrast. RADIATION DOSE REDUCTION: This exam was performed according to the departmental dose-optimization program which includes automated exposure control, adjustment of the mA and/or kV according to patient size and/or  use of iterative reconstruction technique. COMPARISON:  Calcium score CT 09/03/2019 FINDINGS: Alignment: Mild upper thoracic dextroscoliosis.  No listhesis. Vertebrae: Acute fractures of the T4 through T8 spinous processes with up to mild displacement. Mild chronic wedging of multiple lower thoracic vertebral bodies. No definite acute vertebral body fracture. No suspicious osseous lesion. Paraspinal and other soft tissues: Punctate nonobstructing calculus in the upper pole of the left kidney. Postsurgical changes in the GE junction region. Disc levels: Mild disc and facet degeneration in the mid and lower thoracic spine. No evidence of high-grade spinal canal stenosis. Mild-to-moderate neural foraminal stenosis on the left at T9-10 and on the right at T10-11 due to facet spurring. IMPRESSION: Acute T4-T8 spinous process fractures. Electronically Signed   By: Sebastian Ache M.D.   On: 02/15/2023 16:30    Procedures Procedures    Medications Ordered in ED Medications  ketorolac (TORADOL) 30 MG/ML injection 30 mg (30 mg Intramuscular Given 02/15/23 1850)    ED Course/ Medical Decision Making/ A&P                                 Medical Decision Making Risk Prescription drug management.   This patient presents to the ED for concern of back pain s/p fall, this involves an extensive number of treatment options, and is a complaint that carries with it a high risk of complications and morbidity.  The differential diagnosis includes fx, strain   Co morbidities that complicate the patient evaluation   htn, gerd, and depression   Additional history obtained:  Additional history obtained from epic chart review  Imaging Studies ordered:  I ordered imaging studies including ct chest  I independently visualized and interpreted imaging which showed Acute T4-T8 spinous process fractures.  I agree with the radiologist interpretation   Medicines ordered and prescription drug management:  I ordered  medication including toradol  for sx  Reevaluation of the patient after these medicines showed that the patient improved I have reviewed the patients home medicines and have made adjustments as needed   Test Considered:  ct   Problem List / ED Course:  Acute fx T4-T8. Pt placed in a brace.  He is stable for d/c.  Return if worse.    Reevaluation:  After the interventions noted above, I reevaluated the patient and found that they have :improved   Social Determinants of Health:  Lives at home   Dispostion:  After consideration of the  diagnostic results and the patients response to treatment, I feel that the patent would benefit from discharge with outpatient f/u.          Final Clinical Impression(s) / ED Diagnoses Final diagnoses:  Closed fracture of spinous process of thoracic vertebra, initial encounter [facility])    Rx / DC Orders ED Discharge Orders          Ordered    HYDROcodone-acetaminophen (NORCO/VICODIN) 5-325 MG tablet  Every 4 hours PRN        02/15/23 1817    ibuprofen (ADVIL) 600 MG tablet  Every 6 hours PRN        02/15/23 1818              Jacalyn Lefevre, MD 02/15/23 1920

## 2023-02-15 NOTE — ED Notes (Signed)
[facility] called for STAT TLSO Brace.

## 2023-05-15 ENCOUNTER — Other Ambulatory Visit: Payer: Self-pay

## 2023-05-15 MED ORDER — METOPROLOL SUCCINATE ER 25 MG PO TB24: 25.0000 mg | ORAL_TABLET | Freq: Every day | ORAL | 0 refills | Status: DC

## 2023-05-29 ENCOUNTER — Encounter: Payer: Self-pay | Admitting: Cardiovascular Disease

## 2023-05-29 ENCOUNTER — Ambulatory Visit: Payer: 59 | Attending: Cardiovascular Disease | Admitting: Cardiovascular Disease

## 2023-05-29 VITALS — BP 130/80 | HR 74 | Ht 70.0 in | Wt 220.0 lb

## 2023-05-29 DIAGNOSIS — R002 Palpitations: Secondary | ICD-10-CM

## 2023-05-29 DIAGNOSIS — E782 Mixed hyperlipidemia: Secondary | ICD-10-CM | POA: Diagnosis not present

## 2023-05-29 DIAGNOSIS — R0789 Other chest pain: Secondary | ICD-10-CM

## 2023-05-29 NOTE — Assessment & Plan Note (Signed)
History of hyperlipidemia not on statin therapy followed by his PCP 

## 2023-05-29 NOTE — Assessment & Plan Note (Signed)
Patient denies chest pain since he saw me last

## 2023-05-29 NOTE — Assessment & Plan Note (Signed)
No longer a clinical issue

## 2023-05-29 NOTE — Progress Notes (Signed)
05/29/2023 Eric Goodman   01/13/1961  [id number]  Primary Physician Deatra James, MD Primary Cardiologist: Runell Gess MD [facility] [address]oll, [place], [place]  HPI:  Eric Goodman is a 62 y.o.    mildly overweight married Caucasian male with no children referred by Dr. Wynelle Link for cardiovascular valuation because of palpitation and atypical chest pain.  I last saw him in the office 10/06/2021. 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  He has seen Dr. Willa Rough, cardiologist, 10 years ago for evaluation of palpitations, atypical chest pain and hyperlipidemia.  He did have a negative stress echo at that time.  He has no other cardiac risk factors other than mild untreated hyperlipidemia.  His father apparently had an ICD was taken care of by Dr. Alanda Amass as mother may have had stents as well.  He has been fairly asymptomatic until several weeks ago when he had recurrence of his palpitations.  He was recently seen in the emergency room 08/07/2019 where he was in sinus rhythm his work-up was unremarkable.   I got an event monitor which showed PVCs with short runs of SVT and a coronary calcium score performed 09/03/2019 which was 0.  I did start him on low-dose beta-blockade which was resulted in marked improvement in his palpitations.  He is also seeing a psychologist to manage stress which may also have an impact on his palpitations.  He has atypical chest pain which I have reassured him is noncardiac.   Since I saw him in the office a year and a half ago he has done well.  Unfortunately his father-in-law finally passed away which was a cause of stress.  He denies chest pain, shortness of breath or palpitations.   Current Meds  Medication Sig   ibuprofen (ADVIL) 600 MG tablet Take 1 tablet (600 mg total) by mouth every 6 (six) hours as needed.   LORazepam (ATIVAN) 0.5 MG tablet Take 0.5 mg by mouth every 8 (eight) hours.   metoprolol succinate (TOPROL-XL) 25 MG 24 hr tablet Take 1 tablet (25 mg  total) by mouth daily. NEED OV.   omeprazole (PRILOSEC) 20 MG capsule Take 20 mg by mouth daily.   sertraline (ZOLOFT) 50 MG tablet Take 100 mg by mouth daily.      Allergies  Allergen Reactions   Aspirin     Social History   Socioeconomic History   Marital status: Married    Spouse name: Not on file   Number of children: Not on file   Years of education: Not on file   Highest education level: Not on file  Occupational History   Not on file  Tobacco Use   Smoking status: Never   Smokeless tobacco: Current    Types: Snuff  Vaping Use   Vaping status: Never Used  Substance and Sexual Activity   Alcohol use: Yes    Alcohol/week: 8.0 standard drinks of alcohol    Types: 8 Cans of beer per week   Drug use: Never   Sexual activity: Not on file  Other Topics Concern   Not on file  Social History Narrative   Not on file   Social Determinants of Health   Financial Resource Strain: Not on file  Food Insecurity: Not on file  Transportation Needs: Not on file  Physical Activity: Not on file  Stress: Not on file  Social Connections: Not on file  Intimate Partner Violence: Not on file     Review of Systems:  General: negative for chills, fever, night sweats or weight changes.  Cardiovascular: negative for chest pain, dyspnea on exertion, edema, orthopnea, palpitations, paroxysmal nocturnal dyspnea or shortness of breath Dermatological: negative for rash Respiratory: negative for cough or wheezing Urologic: negative for hematuria Abdominal: negative for nausea, vomiting, diarrhea, bright red blood per rectum, melena, or hematemesis Neurologic: negative for visual changes, syncope, or dizziness All other systems reviewed and are otherwise negative except as noted above.    Blood pressure (!) 140/90, pulse 74, height 5\' 10"  (1.778 m), weight 220 lb (99.8 kg), SpO2 94%.  General appearance: alert and no distress Neck: no adenopathy, no carotid bruit, no JVD, supple,  symmetrical, trachea midline, and thyroid not enlarged, symmetric, no tenderness/mass/nodules Lungs: clear to auscultation bilaterally Heart: regular rate and rhythm, S1, S2 normal, no murmur, click, rub or gallop Extremities: extremities normal, atraumatic, no cyanosis or edema Pulses: 2+ and symmetric Skin: Skin color, texture, turgor normal. No rashes or lesions Neurologic: Grossly normal  EKG EKG Interpretation Date/Time:  Monday May 29 2023 15:49:26 EST Ventricular Rate:  73 PR Interval:  154 QRS Duration:  80 QT Interval:  382 QTC Calculation: 420 R Axis:   31  Text Interpretation: Normal sinus rhythm Normal ECG When compared with ECG of 07-Aug-2019 16:24, Premature ventricular complexes are no longer Present Confirmed by Nanetta Batty [phone]) on 05/29/2023 4:13:40 PM    ASSESSMENT AND PLAN:   Palpitations No longer a clinical issue  CHEST PAIN Patient denies chest pain since he saw me last  Hyperlipidemia History of hyperlipidemia not on statin therapy followed by his PCP     Runell Gess MD [facility], [facility] 05/29/2023 4:19 PM

## 2023-05-29 NOTE — Patient Instructions (Signed)

## 2023-06-12 ENCOUNTER — Other Ambulatory Visit: Payer: Self-pay | Admitting: Cardiovascular Disease

## 2023-06-13 MED ORDER — METOPROLOL SUCCINATE ER 25 MG PO TB24: 25.0000 mg | ORAL_TABLET | Freq: Every day | ORAL | 3 refills | Status: DC

## 2023-06-13 NOTE — Telephone Encounter (Signed)
Prescription  was sent to the  pharmacy as requested  Patient  was seen in the office 05/29/23

## 2024-05-29 ENCOUNTER — Telehealth: Payer: Self-pay | Admitting: Cardiovascular Disease

## 2024-05-29 NOTE — Telephone Encounter (Signed)
*  STAT* If patient is at the pharmacy, call can be transferred to refill team.   1. Which medications need to be refilled? (please list name of each medication and dose if known)   metoprolol  succinate (TOPROL -XL) 25 MG 24 hr tablet    2. Which pharmacy/location (including street and city if local pharmacy) is medication to be sent to?  [facility] [phone] [facility] - [place], [place] - [address]      3. Do they need a 30 day or 90 day supply? 90 day

## 2024-05-30 ENCOUNTER — Other Ambulatory Visit: Payer: Self-pay | Admitting: Cardiovascular Disease

## 2024-05-30 MED ORDER — METOPROLOL SUCCINATE ER 25 MG PO TB24: 25.0000 mg | ORAL_TABLET | Freq: Every day | ORAL | 3 refills | Status: AC

## 2024-05-30 NOTE — Telephone Encounter (Signed)
 Refill sent.

## 2024-06-17 ENCOUNTER — Ambulatory Visit: Admitting: Cardiovascular Disease

## 2024-07-17 ENCOUNTER — Ambulatory Visit: Admitting: Cardiovascular Disease

## 2024-07-23 ENCOUNTER — Ambulatory Visit: Admitting: Cardiovascular Disease

## 2024-07-23 ENCOUNTER — Encounter: Payer: Self-pay | Admitting: Cardiovascular Disease

## 2024-07-23 VITALS — BP 119/77 | HR 80 | Ht 70.0 in

## 2024-07-23 DIAGNOSIS — R002 Palpitations: Secondary | ICD-10-CM

## 2024-07-23 DIAGNOSIS — E782 Mixed hyperlipidemia: Secondary | ICD-10-CM | POA: Diagnosis not present

## 2024-07-23 NOTE — Assessment & Plan Note (Signed)
 History of palpitations in the past found to be PVCs and short runs of SVT on the monitor which are no longer an issue.  He is on the low-dose beta-blocker.

## 2024-07-23 NOTE — Assessment & Plan Note (Signed)
 History of hyperlipidemia not on statin therapy followed at his place of work.

## 2024-07-23 NOTE — Patient Instructions (Signed)
 Medication Instructions:  Your physician recommends that you continue on your current medications as directed. Please refer to the Current Medication list given to you today.  *If you need a refill on your cardiac medications before your next appointment, please call your pharmacy*   Follow-Up: At [facility], you and your health needs are our priority.  As part of our continuing mission to provide you with exceptional heart care, our providers are all part of one team.  This team includes your primary Cardiologist (physician) and Advanced Practice Providers or APPs (Physician Assistants and Nurse Practitioners) who all work together to provide you with the care you need, when you need it.  Your next appointment:   12 month(s)  Provider:   Dorn Lesches, MD    We recommend signing up for the patient portal called MyChart.  Sign up information is provided on this After Visit Summary.  MyChart is used to connect with patients for Virtual Visits (Telemedicine).  Patients are able to view lab/test results, encounter notes, upcoming appointments, etc.  Non-urgent messages can be sent to your provider as well.   To learn more about what you can do with MyChart, go to [email or web address].   Other Instructions Please have labs faxed to our office- [phone]

## 2024-07-23 NOTE — Progress Notes (Unsigned)
 "     07/23/2024 Eric Goodman   Sep 14, 1960  [id number]  Primary Physician Sun, Vyvyan, MD Primary Cardiologist: Dorn JINNY Lesches MD GENI [facility], [place], [place]  HPI:  Eric Goodman is a 64 y.o.  mildly overweight married Caucasian male with no children referred by Dr. Austin for cardiovascular evaluation because of palpitation and atypical chest pain.  I last saw him in the office 05/29/2023. 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  He has seen Dr. Reyes Forget, cardiologist, 10 years ago for evaluation of palpitations, atypical chest pain and hyperlipidemia.  He did have a negative stress echo at that time.  He has no other cardiac risk factors other than mild untreated hyperlipidemia.  His father apparently had an ICD was taken care of by Dr. Maye as mother may have had stents as well.  He has been fairly asymptomatic until several weeks ago when he had recurrence of his palpitations.  He was recently seen in the emergency room 08/07/2019 where he was in sinus rhythm his work-up was unremarkable.   I got an event monitor which showed PVCs with short runs of SVT and a coronary calcium score performed 09/03/2019 which was 0.  I did start him on low-dose beta-blockade which was resulted in marked improvement in his palpitations.  He is also seeing a psychologist to manage stress which may also have an impact on his palpitations.  He has atypical chest pain which I have reassured him is noncardiac.   Since I saw him in the office a yearago he has done well.  He denies chest pain, shortness of breath or palpitations.   Active Medications[1]   Allergies[2]  Social History   Socioeconomic History   Marital status: Married    Spouse name: Not on file   Number of children: Not on file   Years of education: Not on file   Highest education level: Not on file  Occupational History   Not on file  Tobacco Use   Smoking status: Never   Smokeless tobacco: Current    Types: Snuff  Vaping Use    Vaping status: Never Used  Substance and Sexual Activity   Alcohol use: Yes    Alcohol/week: 8.0 standard drinks of alcohol    Types: 8 Cans of beer per week   Drug use: Never   Sexual activity: Not on file  Other Topics Concern   Not on file  Social History Narrative   Not on file   Social Drivers of Health   Tobacco Use: High Risk (07/23/2024)   Patient History    Smoking Tobacco Use: Never    Smokeless Tobacco Use: Current    Passive Exposure: Not on file  Financial Resource Strain: Not on file  Food Insecurity: Not on file  Transportation Needs: Not on file  Physical Activity: Not on file  Stress: Not on file  Social Connections: Not on file  Intimate Partner Violence: Not on file  Depression (EYV7-0): Not on file  Alcohol Screen: Not on file  Housing: Not on file  Utilities: Not on file  Health Literacy: Not on file     Review of Systems: General: negative for chills, fever, night sweats or weight changes.  Cardiovascular: negative for chest pain, dyspnea on exertion, edema, orthopnea, palpitations, paroxysmal nocturnal dyspnea or shortness of breath Dermatological: negative for rash Respiratory: negative for cough or wheezing Urologic: negative for hematuria Abdominal: negative for nausea, vomiting, diarrhea, bright red blood per rectum, melena, or  hematemesis Neurologic: negative for visual changes, syncope, or dizziness All other systems reviewed and are otherwise negative except as noted above.    Blood pressure 119/77, pulse 80, height 5' 10 (1.778 m), SpO2 96%.  General appearance: alert and no distress Neck: no adenopathy, no carotid bruit, no JVD, supple, symmetrical, trachea midline, and thyroid not enlarged, symmetric, no tenderness/mass/nodules Lungs: clear to auscultation bilaterally Heart: regular rate and rhythm, S1, S2 normal, no murmur, click, rub or gallop Extremities: extremities normal, atraumatic, no cyanosis or edema Pulses: 2+ and  symmetric Skin: Skin color, texture, turgor normal. No rashes or lesions Neurologic: Grossly normal  EKG EKG Interpretation Date/Time:  Tuesday July 23 2024 15:30:44 EST Ventricular Rate:  80 PR Interval:  150 QRS Duration:  78 QT Interval:  366 QTC Calculation: 422 R Axis:   44  Text Interpretation: Normal sinus rhythm Normal ECG When compared with ECG of 29-May-2023 15:49, No significant change was found Confirmed by Court Carrier [phone]) on 07/23/2024 3:38:53 PM    ASSESSMENT AND PLAN:   Palpitations History of palpitations in the past found to be PVCs and short runs of SVT on the monitor which are no longer an issue.  He is on the low-dose beta-blocker.  Hyperlipidemia History of hyperlipidemia not on statin therapy followed at his place of work.     Carrier DOROTHA Court MD FACP,FACC,FAHA, FSCAI 07/23/2024 3:43 PM     [1]  Current Meds  Medication Sig   ibuprofen  (ADVIL ) 600 MG tablet Take 1 tablet (600 mg total) by mouth every 6 (six) hours as needed.   metoprolol  succinate (TOPROL -XL) 25 MG 24 hr tablet Take 1 tablet (25 mg total) by mouth daily.   omeprazole (PRILOSEC) 20 MG capsule Take 20 mg by mouth daily.   sertraline (ZOLOFT) 50 MG tablet Take 100 mg by mouth daily.    tamsulosin (FLOMAX) 0.4 MG CAPS capsule Take 0.4 mg by mouth at bedtime.  [2]  Allergies Allergen Reactions   Aspirin    "
